# Patient Record
Sex: Female | Born: 1935 | Race: White | Hispanic: No | Smoking: Former smoker
Health system: Southern US, Community
[De-identification: ages and names within clinical notes are randomized; demographics above are authoritative.]

## PROBLEM LIST (undated history)

## (undated) DIAGNOSIS — I10 Essential (primary) hypertension: Secondary | ICD-10-CM

## (undated) DIAGNOSIS — M8580 Other specified disorders of bone density and structure, unspecified site: Secondary | ICD-10-CM

## (undated) DIAGNOSIS — I779 Disorder of arteries and arterioles, unspecified: Secondary | ICD-10-CM

## (undated) DIAGNOSIS — H409 Unspecified glaucoma: Secondary | ICD-10-CM

## (undated) DIAGNOSIS — I739 Peripheral vascular disease, unspecified: Secondary | ICD-10-CM

## (undated) DIAGNOSIS — R011 Cardiac murmur, unspecified: Secondary | ICD-10-CM

## (undated) DIAGNOSIS — F419 Anxiety disorder, unspecified: Secondary | ICD-10-CM

## (undated) DIAGNOSIS — H9319 Tinnitus, unspecified ear: Secondary | ICD-10-CM

## (undated) DIAGNOSIS — R7301 Impaired fasting glucose: Secondary | ICD-10-CM

## (undated) HISTORY — DX: Other specified disorders of bone density and structure, unspecified site: M85.80

## (undated) HISTORY — DX: Cardiac murmur, unspecified: R01.1

## (undated) HISTORY — DX: Tinnitus, unspecified ear: H93.19

## (undated) HISTORY — DX: Impaired fasting glucose: R73.01

## (undated) HISTORY — DX: Peripheral vascular disease, unspecified: I73.9

## (undated) HISTORY — DX: Essential (primary) hypertension: I10

## (undated) HISTORY — DX: Unspecified glaucoma: H40.9

## (undated) HISTORY — DX: Anxiety disorder, unspecified: F41.9

## (undated) HISTORY — DX: Disorder of arteries and arterioles, unspecified: I77.9

---
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
## 1998-07-02 ENCOUNTER — Other Ambulatory Visit: Admission: RE | Admit: 1998-07-02 | Discharge: 1998-07-02 | Payer: Self-pay | Admitting: Family Medicine

## 1999-08-30 ENCOUNTER — Other Ambulatory Visit: Admission: RE | Admit: 1999-08-30 | Discharge: 1999-08-30 | Payer: Self-pay | Admitting: Family Medicine

## 1999-10-01 ENCOUNTER — Emergency Department (HOSPITAL_COMMUNITY): Admission: EM | Admit: 1999-10-01 | Discharge: 1999-10-01 | Payer: Self-pay | Admitting: Emergency Medicine

## 1999-10-01 ENCOUNTER — Encounter: Payer: Self-pay | Admitting: Emergency Medicine

## 2000-05-29 HISTORY — PX: CHOLECYSTECTOMY: SHX55

## 2000-10-16 ENCOUNTER — Encounter: Admission: RE | Admit: 2000-10-16 | Discharge: 2001-01-14 | Payer: Self-pay | Admitting: Family Medicine

## 2000-11-19 ENCOUNTER — Encounter: Payer: Self-pay | Admitting: General Surgery

## 2000-11-21 ENCOUNTER — Ambulatory Visit (HOSPITAL_COMMUNITY): Admission: RE | Admit: 2000-11-21 | Discharge: 2000-11-21 | Payer: Self-pay | Admitting: *Deleted

## 2000-11-21 ENCOUNTER — Encounter: Payer: Self-pay | Admitting: *Deleted

## 2000-11-22 ENCOUNTER — Encounter (INDEPENDENT_AMBULATORY_CARE_PROVIDER_SITE_OTHER): Payer: Self-pay | Admitting: *Deleted

## 2000-11-22 ENCOUNTER — Inpatient Hospital Stay (HOSPITAL_COMMUNITY): Admission: RE | Admit: 2000-11-22 | Discharge: 2000-11-24 | Payer: Self-pay | Admitting: General Surgery

## 2005-01-28 ENCOUNTER — Ambulatory Visit (HOSPITAL_COMMUNITY): Admission: RE | Admit: 2005-01-28 | Discharge: 2005-01-28 | Payer: Self-pay | Admitting: Family Medicine

## 2005-02-02 ENCOUNTER — Emergency Department (HOSPITAL_COMMUNITY): Admission: EM | Admit: 2005-02-02 | Discharge: 2005-02-03 | Payer: Self-pay | Admitting: Emergency Medicine

## 2005-02-05 ENCOUNTER — Inpatient Hospital Stay (HOSPITAL_COMMUNITY): Admission: EM | Admit: 2005-02-05 | Discharge: 2005-02-07 | Payer: Self-pay | Admitting: Emergency Medicine

## 2008-01-29 ENCOUNTER — Emergency Department (HOSPITAL_COMMUNITY): Admission: EM | Admit: 2008-01-29 | Discharge: 2008-01-29 | Payer: Self-pay | Admitting: Emergency Medicine

## 2008-02-07 ENCOUNTER — Ambulatory Visit: Payer: Self-pay | Admitting: Vascular Surgery

## 2008-03-25 ENCOUNTER — Ambulatory Visit: Payer: Self-pay | Admitting: Vascular Surgery

## 2009-03-10 ENCOUNTER — Ambulatory Visit: Payer: Self-pay | Admitting: Vascular Surgery

## 2010-06-16 ENCOUNTER — Ambulatory Visit
Admission: RE | Admit: 2010-06-16 | Discharge: 2010-06-16 | Payer: Self-pay | Source: Home / Self Care | Attending: Vascular Surgery | Admitting: Vascular Surgery

## 2010-06-19 ENCOUNTER — Encounter: Payer: Self-pay | Admitting: Family Medicine

## 2010-06-24 NOTE — Procedures (Unsigned)
CAROTID DUPLEX EXAM  INDICATION:  Carotid stenosis.  HISTORY: Diabetes:  No. Cardiac:  No. Hypertension:  Yes. Smoking:  No. Previous Surgery:  No. CV History:  No. Amaurosis Fugax No, Paresthesias No, Hemiparesis No                                      RIGHT             LEFT Brachial systolic pressure:         178               180 Brachial Doppler waveforms:         Normal            Normal Vertebral direction of flow:        Antegrade         Antegrade DUPLEX VELOCITIES (cm/sec) CCA peak systolic                   77                81 ECA peak systolic                   127               123 ICA peak systolic                   162               118 ICA end diastolic                   38                26 PLAQUE MORPHOLOGY:                  Heterogenous      Mixed PLAQUE AMOUNT:                      Minimal/moderate  Minimal PLAQUE LOCATION:                    ECA, ICA          CCA, ICA, ECA  IMPRESSION: 1. Right ICA velocities suggest 40-59% stenosis. 2. Left ICA velocities suggest 1-39% stenosis. 3. Antegrade vertebral arteries bilaterally.  ___________________________________________ Janetta Hora Fields, MD  EM/MEDQ  D:  06/16/2010  T:  06/16/2010  Job:  [id number]

## 2010-10-11 NOTE — Procedures (Signed)
CAROTID DUPLEX EXAM   INDICATION:  Left arm and hand numbness.   HISTORY:  Diabetes:  No.  Cardiac:  No.  Hypertension:  Yes.  Smoking:  Previous.  Previous Surgery:  No.  CV History:  Left arm and hand numbness.  Amaurosis Fugax No, Paresthesias No, Hemiparesis No.                                       RIGHT             LEFT  Brachial systolic pressure:         153               153  Brachial Doppler waveforms:         Normal            Normal  Vertebral direction of flow:        Antegrade         Antegrade  DUPLEX VELOCITIES (cm/sec)  CCA peak systolic                   91                122  ECA peak systolic                   140               138  ICA peak systolic                   182               110  ICA end diastolic                   35                18  PLAQUE MORPHOLOGY:                  Heterogenous      Heterogenous  PLAQUE AMOUNT:                      Moderate          Mild  PLAQUE LOCATION:                    ICA/ECA           ICA/ECA   IMPRESSION:  1. 40-59% stenosis of the right internal carotid artery.  2. 1-39% stenosis of the left internal carotid artery.   Preliminary report was faxed to Dr. Jeannetta Nap office on 3:45 at 02/07/08.   ___________________________________________  Di Kindle. Edilia Bo, M.D.   CH/MEDQ  D:  02/07/2008  T:  02/07/2008  Job:  [id number]

## 2010-10-11 NOTE — Assessment & Plan Note (Signed)
OFFICE VISIT   CAELI, LINEHAN  DOB:  06/15/1920                                       03/25/2008  OZHYQ#:[id number]   The patient is an 77 year old female referred for evaluation of  asymptomatic carotid stenosis.  Her atherosclerotic risk factors include  age, hypertension, elevated cholesterol.  She denies history of prior  stroke or TIA.  She denies history of coronary artery disease.   PAST SURGICAL HISTORY:  Otherwise remarkable for a cholecystectomy.   PAST MEDICAL HISTORY:  As above and glaucoma.   MEDICATIONS:  Altace 10 mg once a day.  Atenolol 50 mg twice a day.  Hydrochlorothiazide 25 mg once a day.  Clonidine 0.2 mg 1-1/2 tablets in  the morning 1-1/2 tablets in the evening.  Lipitor 10 mg once a day.  Lorazepam 0.5 mg twice a day.  Aspirin 81 mg once a day.  Centrum 1 a  day.  Caltrate 600 D once a day.  Trazodone p.r.n. sleep.   She has no known drug allergies.   FAMILY HISTORY:  Unremarkable.   SOCIAL HISTORY:  She is widowed.  She is a former smoker, but quit 25  years ago.  She does not consume alcohol regularly.   REVIEW OF SYSTEMS:  She is 5 feet 4 inches, 145 pounds.  Cardiac,  pulmonary, GI, renal, vascular, neurologic, orthopedic, psychiatric,  ENT, and hematologic review of systems are all negative.   PHYSICAL EXAMINATION:  Blood pressure is 200/90 in the left arm, 207/90  in the right arm, pulse 61 and regular, temperature is 98.  HEENT is  unremarkable.  Neck has 2+ carotid pulses without bruits.  Chest is  clear to auscultation.  Cardiac exam is regular rate and rhythm without  murmur.  Abdomen is soft, nontender, nondistended with no masses.  Extremities, she has no edema.  She has 2+ carotid, brachial, radial,  femoral, dorsalis pedis pulses bilaterally.  She has 1+ popliteal pulses  bilaterally.   The patient had a carotid duplex exam on September 11, which showed a 40-  60% right internal carotid artery stenosis  and minimal stenosis on the  left side.  She had antegrade vertebral flow bilaterally.  This is  fairly similar to a previous carotid duplex exam performed in October  2007.   In summary, the patient is an 77 year old female who currently has no  neurologic symptoms, who had a recent duplex carotid exam, which showed  moderate stenosis.  I have reassured her that her risk of stroke from  this carotid stenosis would be very low at this level of stenosis.  She  should continue risk factor modification with treatment of her  hypertension, elevated cholesterol as Dr. Jeannetta Nap is doing.  She will  also continue her aspirin daily.  She needs a followup carotid duplex  scan in 1 year.  If, at that time, the symptoms have not changed  significantly, she may not need further exams after that, in light of  her advanced age.   Janetta Hora. Fields, MD  Electronically Signed   CEF/MEDQ  D:  04/01/2008  T:  04/02/2008  Job:  [id number]   cc:   Windle Guard, M.D.

## 2010-10-11 NOTE — Procedures (Signed)
CAROTID DUPLEX EXAM   INDICATION:  Follow up carotid artery disease.   HISTORY:  Diabetes:  No.  Cardiac:  No.  Hypertension:  Yes.  Smoking:  Previous.  Previous Surgery:  No.  CV History:  Asymptomatic.  Amaurosis Fugax No, Paresthesias No, Hemiparesis No.                                       RIGHT             LEFT  Brachial systolic pressure:         192               200  Brachial Doppler waveforms:         WNL               WNL  Vertebral direction of flow:        Antegrade         Antegrade  DUPLEX VELOCITIES (cm/sec)  CCA peak systolic                   94                129  ECA peak systolic                   147               172  ICA peak systolic                   178               108  ICA end diastolic                   40                18  PLAQUE MORPHOLOGY:                  Mixed             Mixed  PLAQUE AMOUNT:                      Moderate          Mild  PLAQUE LOCATION:                    ICA/ECA/CCA       ICA/ECA/CCA   IMPRESSION:  1. Right internal carotid artery shows evidence of 40-59% stenosis.  2. Left internal carotid artery shows evidence of 20-39% stenosis.  3. No significant changes from previous study.   ___________________________________________  Janetta Hora Fields, MD   AS/MEDQ  D:  03/10/2009  T:  03/10/2009  Job:  [id number]   cc:   Thora Lance, M.D.

## 2010-10-14 NOTE — Discharge Summary (Signed)
NAME:  [name], SCHNYDER NO.:  [id number]   MEDICAL RECORD NO.:  [id number]          PATIENT TYPE:  INP   LOCATION:  5511                         FACILITY:  [facility]   PHYSICIAN:  Theone Stanley, MD   DATE OF BIRTH:  1921/01/10   DATE OF ADMISSION:  02/05/2005  DATE OF DISCHARGE:                                 DISCHARGE SUMMARY   ADMITTING DIAGNOSES:  1.  Dizziness, confusion, vertigo.  2.  Dehydration.  3.  Mild renal insufficiency secondary to poor oral intake.  4.  Hypertension.  5.  Hyperlipidemia.  6.  Mild urinary tract infection.   DISCHARGE DIAGNOSES:  1.  Dizziness, congestion, vertigo.  2.  Dehydration.  3.  Mild renal insufficiency secondary to poor oral intake.  4.  Hypertension.  5.  Hyperlipidemia.  6.  Mild urinary tract infection.   CONSULTS:  Dr. Ezzard Standing and Dr. Lavonia Drafts of ENT was contacted in regard to  patient's tinnitus and they both expressed that this can be done and best be  done as an outpatient work-up.   PERTINENT LABORATORIES AND RADIOLOGY:  White count of 11, hemoglobin of 12,  hematocrit at 36, platelets at 270.  Sodium 134, potassium of 2.7, chloride  at 107, CO2 of 24, glucose at 135, BUN at 16, creatinine at 1.3.  An MRI was  ordered by Dr. Jeannetta Nap on September 2 which impression showed chronic  microvascular ischemic changes, no acute stroke, no abnormal intracranial  enhancement, no significant sinus disease.   HOSPITAL COURSE:  Mrs. [name] is a pleasant 77 year old female who is very  anxious and her main complaint is ringing in her ears.  Upon her  presentation, however, it was noted that she had some renal insufficiency  most likely secondary to poor oral intake.  After hydration her creatinine  came down to near normal.  In addition, because of her complaints of  dizziness orthostatics were performed which showed lying blood pressure  162/93, pulse of 109, sitting blood pressure of 142/83, pulse of 111,  standing  blood pressure 110/50, pulse of 111.  Because of this all her blood  pressure medications originally were stopped.  However, during her time here  in the hospital it was noted that she had some sinus tachycardia.  It was  felt that two reasons for this; number one, patient's high anxiety and  number two, her sudden stop in her beta blocker and also her Cardizem.  I  was able to contact Dr. Jeannetta Nap in regards to this patient and inform him of  her current state that she is mildly dehydrated, that she has been  rehydrated, and that I have stopped the majority of her blood pressure  medications secondary to her orthostatic BP.  In addition, per family  request I called Dr. Ezzard Standing to see if the appointment can be moved up;  however, it was not possible.  Upon her discharge patient was sitting up and  even at time joking indicating although she states she does not feel any  better in regard to her tinnitus, it appeared to be she was in better  spirits.   DISCHARGE MEDICATIONS:  1.  Valium 2.5 mg one p.o. t.i.d. p.r.n.  2.  Meclizine 25 mg one p.o. q.i.d.  3.  Atenolol 25 mg one p.o. daily.  4.  Aspirin is to be held until okay from Dr. Ezzard Standing.  He has stated that      this might make her tinnitus worse.  5.  Lipitor 10 mg one p.o. daily.  6.  She is to hold her Altace, trazodone if she is taking the Valium, and      hydrochlorothiazide.  7.  She is also to hold her verapamil unless her blood pressure is greater      or equal to 175.  She is to take her verapamil and then call Dr. Jeannetta Nap      with the situation.  8.  She is to continue her eye drops for her glaucoma.  9.  Vitamins.  10. Cipro 250 mg one p.o. b.i.d. for an additional five days.  11. Chlorpheniramine 4 mg one p.o. q.6 h. p.r.n.   DISCHARGE INSTRUCTIONS:  Patient was instructed to monitor her blood  pressure, record the results, and take that to Dr. Jeannetta Nap.  She was  encouraged to eat and drink to prevent any  dehydration.      Theone Stanley, MD  Electronically Signed     AEJ/MEDQ  D:  02/07/2005  T:  02/07/2005  Job:  [id number]   cc:   Windle Guard, M.D.  [address]  [place], [place] [address]  Fax: [phone]   Kristine Garbe. Ezzard Standing, M.D.  [address] [place]  [place] [address]  Fax: [phone]

## 2010-10-14 NOTE — H&P (Signed)
NAME:  [name], [name] NO.:  [id number]   MEDICAL RECORD NO.:  [id number]          PATIENT TYPE:  INP   LOCATION:  5511                         FACILITY:  [facility]   PHYSICIAN:  Hollice Espy, M.D.DATE OF BIRTH:  1921-05-22   DATE OF ADMISSION:  02/05/2005  DATE OF DISCHARGE:                                HISTORY & PHYSICAL   PRIMARY CARE PHYSICIAN:  Dr. Windle Guard   CHIEF COMPLAINT:  Dizziness, confusion, and vertigo.   Patient is an 77 year old white female with past medical history of  hypertension and hyperlipidemia who presents for several days of worsening  vertigo and lightheadedness.  Patient has been relatively well.  She is  followed by Dr. Jeannetta Nap as an outpatient and her only interhospital procedure  as of record was a cholecystectomy done in 2002 and says she has been fine  but for the past five or six days she has had problems with worsening  dizziness and lightheadedness on standing to the point where she can barely  stand up.  She has had no fevers, no nausea, vomiting, no ear pain, no  shortness of breath, no productive cough, and no dysuria, although this  history is obtained more from her husband as patient is currently confused  and not giving much of a history.  She came into the emergency room today  when she was so weak she could barely stand up and could not get out of bed.  She came into the emergency room for further evaluation.  In the emergency  room patient was noted to have an elevated blood pressure of 144/79.  Saturations were normal but her laboratories noted a white count of 14.7, a  neutrophil count of 77%.  H&H was unremarkable.  MCV of 91, platelet count  of 252.  The rest of her laboratories were unremarkable.  A BUN was noted to  be elevated at 37, a creatinine of 2, and a UA showed a moderate leukocyte  esterase with rare bacteria and 3-6 white cells.  Her CPK level was elevated  as well at 270 but her MB and troponin  levels were normal.  EKG was  unremarkable.  In addition, patient had just been to [facility] on  Saturday with having an MRI done as ordered by Dr. Jeannetta Nap which was  unremarkable for anything but some chronic atrophy and microvesicular  change.  Currently patient states she is doing okay, however, she goes in  and out of consciousness and occasionally reaches out her hands to grab  things that are not there.  I am unable to get a full review of systems  because of that.   PAST MEDICAL HISTORY:  Patient is status post a laparoscopic  cholecystectomy.  She has a history of hypertension, history of  hyperlipidemia.   MEDICATIONS:  Altace, verapamil, HCTZ, Lipitor, atenolol, aspirin,  Trazodone, Valium, and recently Phenergan and Valium.   She has no known drug allergies.   SOCIAL HISTORY:  No tobacco, alcohol, or drug use.  She lives with her  husband.   FAMILY HISTORY:  Noncontributory.   PHYSICAL  EXAMINATION:  VITAL SIGNS:  Temperature 97, heart rate 76, blood  pressure 144/79, O2 saturation 95% on room air.  GENERAL:  Patient appears to be sleeping, but easily awakened.  She is  alert.  She is oriented x2.  HEENT:  Normocephalic, atraumatic.  Cranial nerves appear to all be normal,  although a limited examination because of her confusion.  Mucous membranes  are dry.  She has no carotid bruits.  HEART:  Regular rate and rhythm, but very soft.  LUNGS:  Clear to auscultation bilaterally.  ABDOMEN:  Soft, nontender, nondistended.  Positive bowel sounds.  EXTREMITIES:  No clubbing, cyanosis, and trace edema.  Poor peripheral  pulses.   LABORATORIES:  White count 14.7, H&H 13 and 37.8, MCV 91, platelet count  252, 77% neutrophils which is on the high end of normal.  Sodium 132,  potassium 4, chloride 96, bicarbonate 24, BUN 37, creatinine 2, glucose 98.  LFTs are unremarkable.  UA is noted only for a moderate leukocyte esterase,  but only 3-6 white cells and rare bacteria.   CPK 270, MB 4.5, troponin I  less than 0.05.   ASSESSMENT/PLAN:  Acute renal failure.  The cause of this may be infection  from a urinary tract infection and/or patient's hypotensive agents,  especially her ACE and diuretic.  Would favor for now holding all of her  blood pressure medications, treating her with IV fluids.  She has no history  of congestive heart failure, so can treat her with IV running at 100  mL/hour.  Will also start her on IV Cipro.  1.  Report of history of dizziness.  This could be from orthostasis      secondary to dehydration but on the other hand it could be from an ear      infection, although she seems to have no ear pain.  When I tried to      examine her ears she moved around too much.  I was unable to get a good      visualization, however, intravenous Cipro should still cover this      regardless.  We will be able to follow her white count.  2.  Elevated CPK.  This is likely secondary to her being in bed for the last      few days with little ambulation.  3.  Leukocytosis, likely I feel from either a urinary tract infection or      another source.  Does not appear to be lung related.  4.  Hypertension.  Holding her antihypertensive agents.  Have p.r.n.      Lopressor ordered if her blood pressure should go higher.  5.  Hyperlipidemia.  Do not feel that her elevated CPK is secondary to      Lipitor.  It is too low for this, but will follow this.      Hollice Espy, M.D.  Electronically Signed     SKK/MEDQ  D:  02/05/2005  T:  02/06/2005  Job:  [id number]   cc:   Windle Guard, M.D.  [address]  [place], [place] [address]  Fax: [phone]

## 2010-10-14 NOTE — Op Note (Signed)
[facility]  Patient:    [name], [name]                     MRN: [id number] Proc. Date: 11/22/00 Adm. Date:  [id number] Attending:  Chevis Pretty S                           Operative Report  PREOPERATIVE DIAGNOSIS:  Cholelithiasis.  POSTOPERATIVE DIAGNOSIS:  Cholecystitis with cholelithiasis.  PROCEDURE:  Aborted laparoscopic and subsequent open cholecystectomy.  SURGEON:  Ollen Gross. Vernell Morgans, M.D.  ASSISTANT:  Milus Mallick, M.D.  ANESTHESIA:  General endotracheal.  DESCRIPTION OF PROCEDURE:  After informed consent was obtained, the patient was brought to the operating room and placed in the supine position on the operating room table.  After adequate induction of general anesthesia, the patients abdomen was prepped with Betadine and draped in the usual sterile manner.  The supraumbilical area was infiltrated with 0.25% Marcaine and a small transverse incision was made with the 15 blade knife.  This incision was carried down through the skin and subcutaneous tissues, and blunt dissection with the Kelly clamp and Army-Nav[employer] retractors until the linea alba was identified.  The linea alba was incised with the 15 blade knife and each side was grasped with Kocher clamps and elevated.  The preperitoneal space was probed bluntly with a hemostat until the peritoneum was opened and access was gained to the abdominal cavity.  A finger was inserted through this opening and to palpate the anterior abdominal wall, and there did not appear to be any adhesions.  A 0 Vicryl pursestring stitch was placed in the fascia surrounding this opening and a Hasson cannula was then placed through the opening into the abdominal cavity and anchored in place with a 0 Vicryl pursestring stitch. The abdomen was then insufflated with carbon dioxide without difficulty and a laparoscope was placed through the Hasson cannula.  The liver edge was readily identifiable and the  gallbladder appeared to be covered with omentum.  An upper midline small transverse incision was made after infiltrating this area with 0.25% Marcaine and the 10 mm port was placed bluntly through this incision into the abdominal cavity under direct vision.  There were several adhesions to the abdominal wall laterally on the right side of the abdomen, and these adhesions were taken down sharply with laparoscopic scissors.  This cleared the area in the right upper quadrant for placement of two 5 mm ports. The areas on the anterior abdominal wall were chosen and these areas were infiltrated with 0.25% Marcaine.  A stab incision was made with the 15 blade knife and two 5 mm ports were placed through these incisions bluntly into the abdominal cavity under direct vision.  It was readily apparent that the adhesions of the omentum to the gallbladder were fairly dense and difficult to take down laparoscopically, but this area was cleared.  The gallbladder was then elevated anteriorly and superiorly.  The area of the neck of the gallbladder, cystic duct junction was very inflamed, and difficult to dissect laparoscopically.  It appeared as though the area of the gallbladder neck cystic duct junction was identified and dissected in a blunt manner circumferentially, but the anatomy was not clear, and the cystic duct was dilated enough to make it difficult to put clips on.  At this point, the laparoscopic portion of the procedure was aborted and the abdomen was opened with a right subcostal  incision with the 10 blade knife. This incision was carried down through the skin and subcutaneous tissues using the Bovie electrocautery.  The rectus sheath and fascia of the anterior abdominal wall was opened also in a sharp manner with the Bovie electrocautery under direct vision.  Once this was complete, the liver edge was readily identifiable.  A sweetheart retractor was placed to elevate the edge of the right  lobe of the liver to allow visualization of the gallbladder.  The gallbladder was grasped with the Kelly clamp and elevated anteriorly.  Dissection at this point to remove the gallbladder from the liver bed was done with both sharply with the electrocautery and bluntly with the tonsil clamp until the gallbladder neck cystic duct junction was positively identified as the gallbladder narrowed down.  It appeared at the base of the wound that you could see where the cystic duct was connecting to the common duct and care was taken to clamp the cystic duct very high near the gallbladder neck away from where the common duct appeared to be.  Another right angle clamp was then placed above this on the gallbladder to keep the stones from spilling, and the gallbladder neck area was then divided between the two clamps with the Metzenbaum scissors. The gallbladder specimen was sent to pathology.  The cystic duct was then ligated with a 2-0 silk suture ligature.  The wound was then examined and found to be hemostatic.  A drain was brought through the anterior abdominal wall through the lateral most 5 mm port and placed within the liver bed where the gallbladder had sat.  This drain was anchored to the skin with a 2-0 silk suture tie.  The fascia of the anterior abdominal wall was then closed in two layers with running #1 PDS sutures.  The skin incisions were then closed with staples.  The fascia at the supraumbilical port was closed with the previously placed Vicryl pursestring stitch.  Sterile dressings were applied.  The patient tolerated the procedure well.  At the end of the case, all needle, sponge, and instrument counts were correct.  The patient was then awakened and taken to the recovery room in stable condition. DD:  11/22/00 TD:  11/23/00 Job: [id number]

## 2011-03-01 LAB — DIFFERENTIAL
Basophils Relative: 1
Eosinophils Absolute: 0.1
Eosinophils Relative: 1
Monocytes Relative: 9
Neutrophils Relative %: 64

## 2011-03-01 LAB — POCT I-STAT, CHEM 8
Calcium, Ion: 1.15
Glucose, Bld: 120 — ABNORMAL HIGH
HCT: 35 — ABNORMAL LOW
Hemoglobin: 11.9 — ABNORMAL LOW
Potassium: 4
TCO2: 26

## 2011-03-01 LAB — CBC
HCT: 32.7 — ABNORMAL LOW
Hemoglobin: 11.1 — ABNORMAL LOW
MCHC: 33.8
MCV: 94
Platelets: 228
RBC: 3.48 — ABNORMAL LOW
RDW: 13.7
WBC: 7.3

## 2012-02-27 HISTORY — PX: GLAUCOMA SURGERY: SHX656

## 2012-04-02 ENCOUNTER — Encounter: Payer: Self-pay | Admitting: Vascular Surgery

## 2013-12-10 ENCOUNTER — Other Ambulatory Visit: Payer: Self-pay | Admitting: Internal Medicine

## 2013-12-10 DIAGNOSIS — I6529 Occlusion and stenosis of unspecified carotid artery: Secondary | ICD-10-CM

## 2013-12-17 ENCOUNTER — Ambulatory Visit
Admission: RE | Admit: 2013-12-17 | Discharge: 2013-12-17 | Disposition: A | Discharge status: Home / Self Care | Payer: Medicare Other | Source: Ambulatory Visit | Attending: Internal Medicine | Admitting: Internal Medicine

## 2013-12-17 DIAGNOSIS — I6529 Occlusion and stenosis of unspecified carotid artery: Secondary | ICD-10-CM

## 2014-03-11 ENCOUNTER — Other Ambulatory Visit: Payer: Self-pay

## 2014-03-21 ENCOUNTER — Encounter: Payer: Self-pay | Admitting: *Deleted

## 2015-02-12 IMAGING — US US CAROTID DUPLEX BILAT
1 series · 13 of 24 positions shown · non-contrast
Comparison: Report only dated 03/22/2006

CLINICAL DATA: Carotid artery stenosis

EXAM:
BILATERAL CAROTID DUPLEX ULTRASOUND
TECHNIQUE: Gray scale imaging, color Doppler and duplex ultrasound were
performed of bilateral carotid and vertebral arteries in the neck.

[Series 1: us carotid duplex bilat · 0.08mm/px · 13 of 71 slices shown]
[im 1/71]
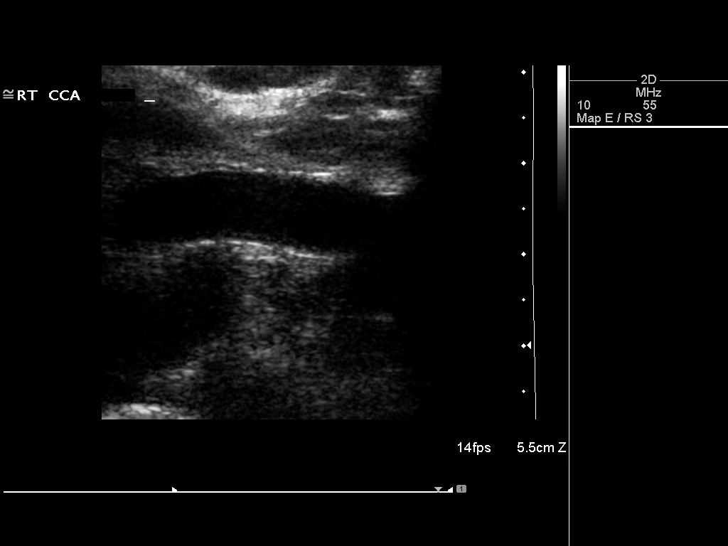
[im 7/71]
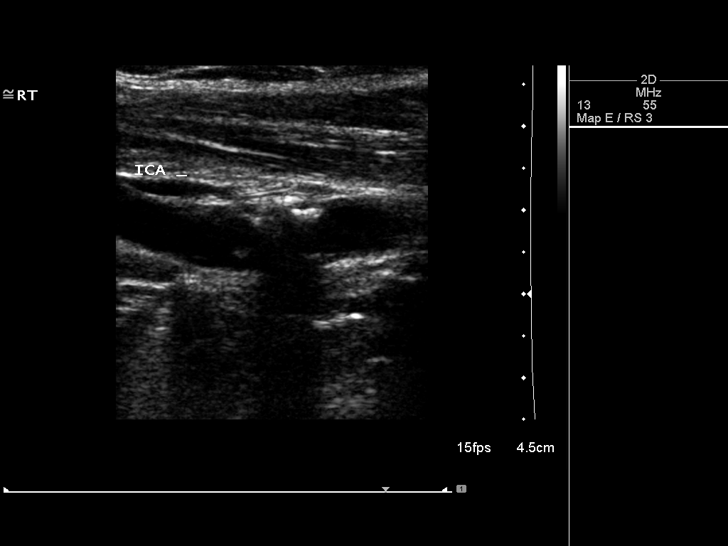
[im 13/71]
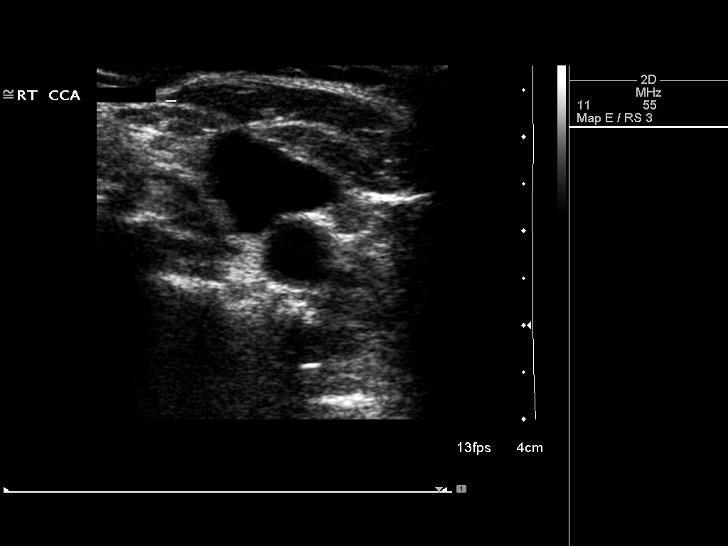
[im 19/71]
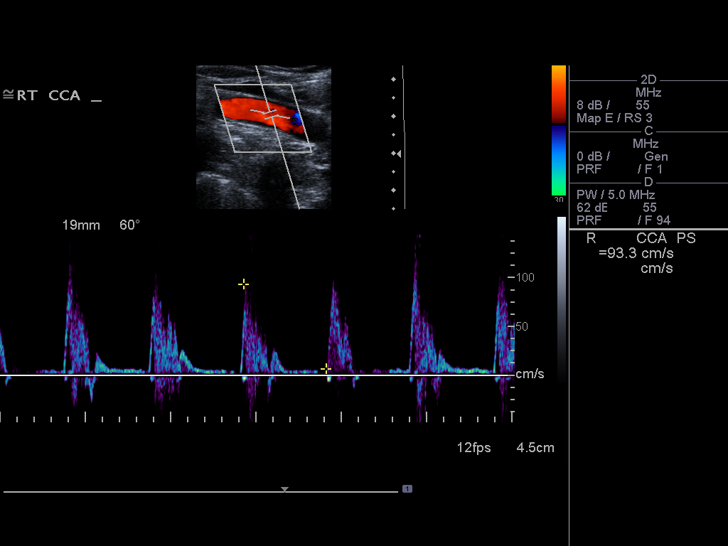
[im 25/71]
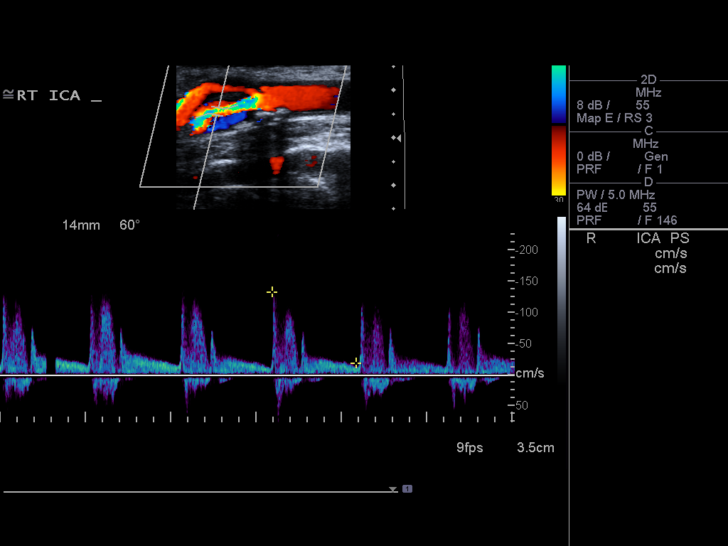
[im 31/71]
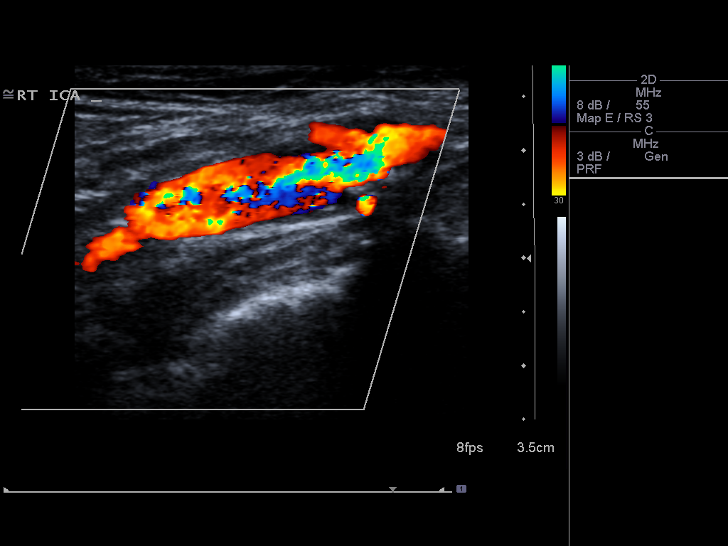
[im 37/71]
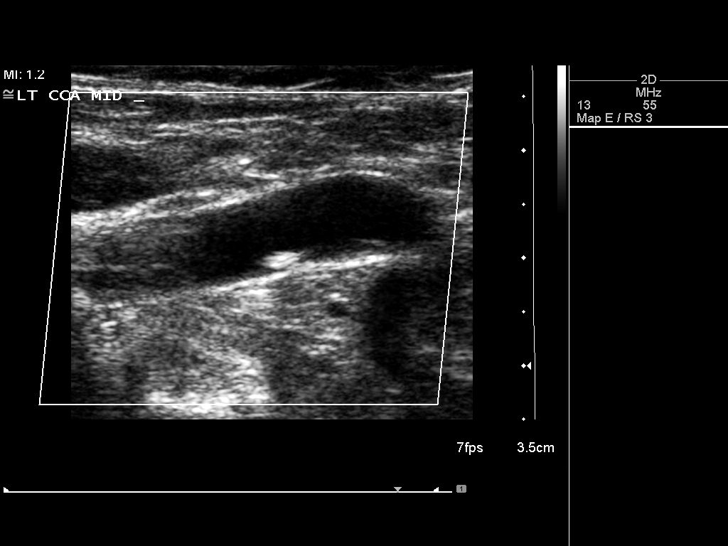
[im 40/71]
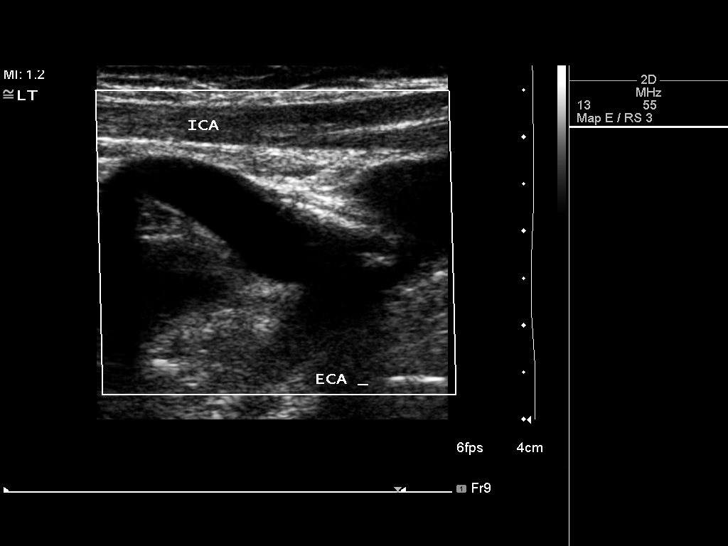
[im 46/71]
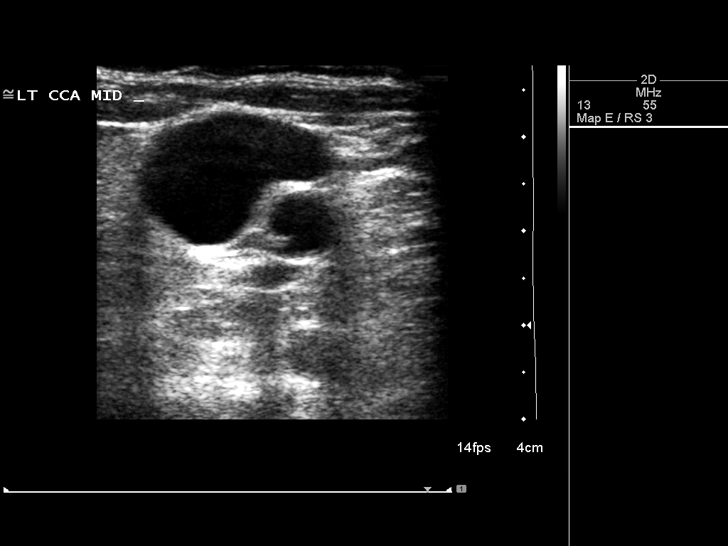
[im 52/71]
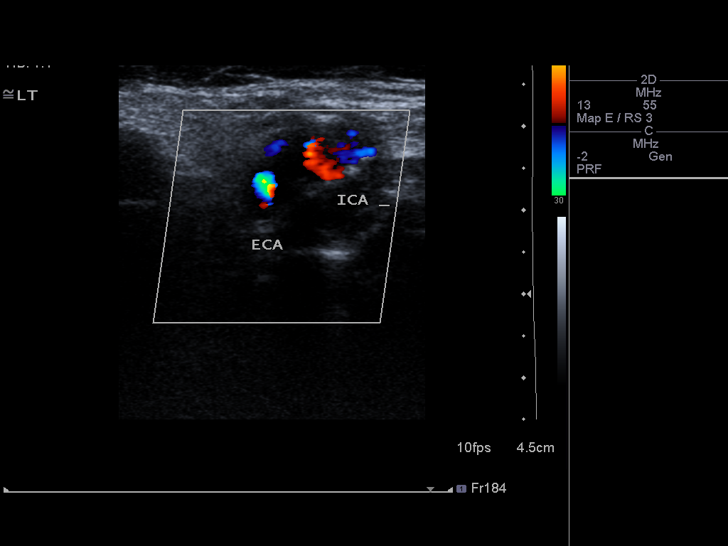
[im 58/71]
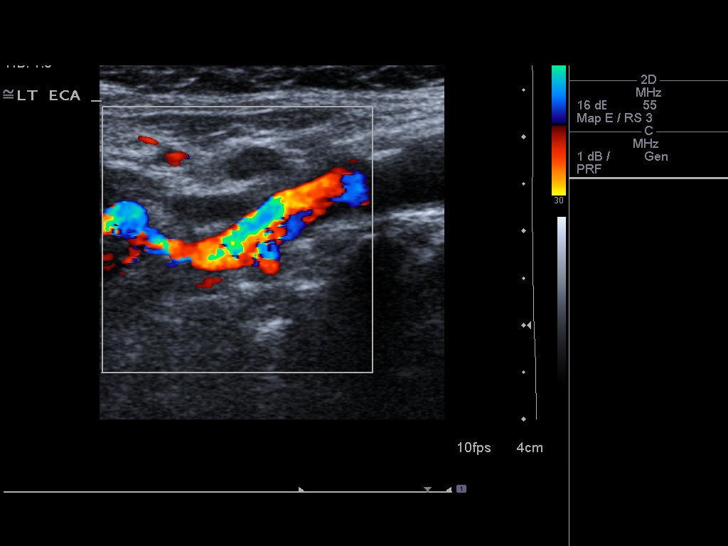
[im 64/71]
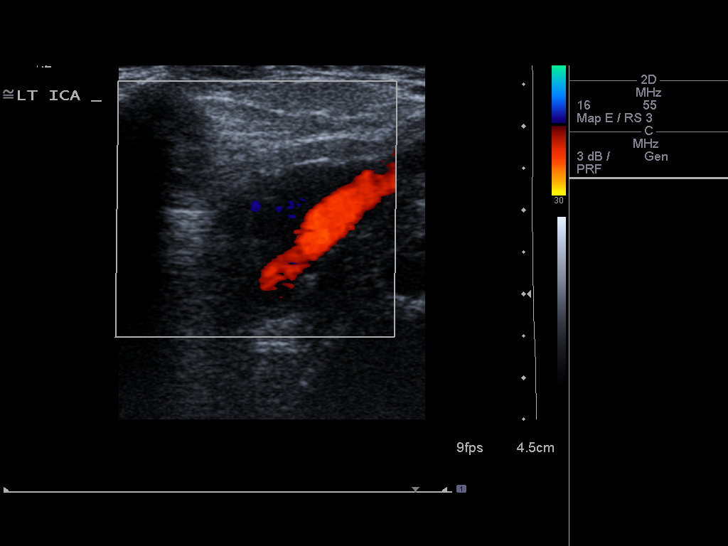
[im 71/71]
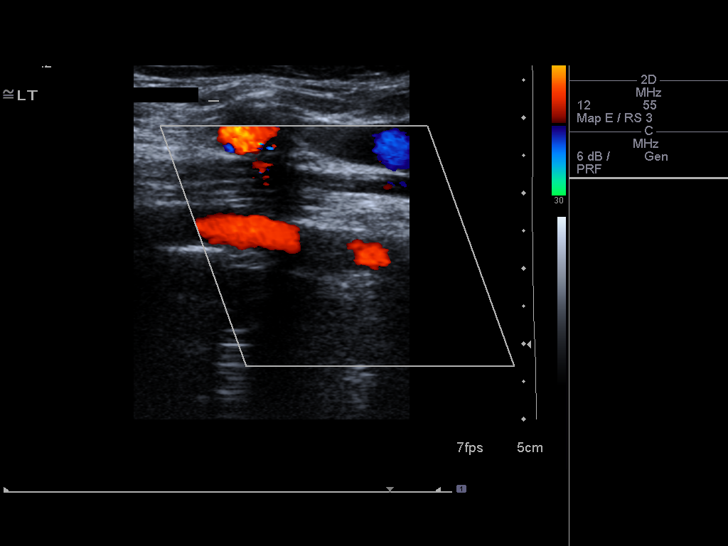

[13 of 24 positions shown; findings below may reference images not displayed]

FINDINGS: Criteria: Quantification of carotid stenosis is based on velocity
parameters that correlate the residual internal carotid diameter
with NASCET-based stenosis levels, using the diameter of the distal
internal carotid lumen as the denominator for stenosis measurement.

The following velocity measurements were obtained:

RIGHT

ICA:  197 cm/sec

CCA:  93 cm/sec

SYSTOLIC ICA/CCA RATIO:

DIASTOLIC ICA/CCA RATIO:

ECA:  104 cm/sec

LEFT

ICA:  153 cm/sec

CCA:  108 cm/sec

SYSTOLIC ICA/CCA RATIO:

DIASTOLIC ICA/CCA RATIO:

ECA:  166 cm/sec

RIGHT CAROTID ARTERY: Moderate calcified plaque in the bulb and
lower internal carotid. Low resistance internal carotid Doppler
pattern is preserved.

RIGHT VERTEBRAL ARTERY:  Antegrade.  Normal Doppler pattern.

LEFT CAROTID ARTERY: Moderate calcified plaque in the left bulb. Low
resistance internal carotid Doppler pattern.

LEFT VERTEBRAL ARTERY:  Antegrade.  Normal Doppler pattern.
IMPRESSION: 50-69% stenosis in the right and left internal carotid arteries.

## 2015-07-14 ENCOUNTER — Observation Stay (HOSPITAL_COMMUNITY)
Admission: EM | Admit: 2015-07-14 | Discharge: 2015-07-14 | Disposition: A | Discharge status: Home / Self Care | Payer: Medicare Other | Attending: Family Medicine | Admitting: Family Medicine

## 2015-07-14 ENCOUNTER — Other Ambulatory Visit: Payer: Self-pay | Admitting: Physician Assistant

## 2015-07-14 ENCOUNTER — Emergency Department (HOSPITAL_COMMUNITY): Payer: Medicare Other

## 2015-07-14 ENCOUNTER — Encounter (HOSPITAL_COMMUNITY): Payer: Self-pay | Admitting: *Deleted

## 2015-07-14 DIAGNOSIS — H409 Unspecified glaucoma: Secondary | ICD-10-CM | POA: Insufficient documentation

## 2015-07-14 DIAGNOSIS — Z7982 Long term (current) use of aspirin: Secondary | ICD-10-CM | POA: Insufficient documentation

## 2015-07-14 DIAGNOSIS — R079 Chest pain, unspecified: Principal | ICD-10-CM | POA: Diagnosis present

## 2015-07-14 DIAGNOSIS — R05 Cough: Secondary | ICD-10-CM | POA: Insufficient documentation

## 2015-07-14 DIAGNOSIS — Z87891 Personal history of nicotine dependence: Secondary | ICD-10-CM | POA: Insufficient documentation

## 2015-07-14 DIAGNOSIS — I447 Left bundle-branch block, unspecified: Secondary | ICD-10-CM | POA: Diagnosis not present

## 2015-07-14 DIAGNOSIS — R011 Cardiac murmur, unspecified: Secondary | ICD-10-CM

## 2015-07-14 DIAGNOSIS — R9431 Abnormal electrocardiogram [ECG] [EKG]: Secondary | ICD-10-CM | POA: Diagnosis not present

## 2015-07-14 DIAGNOSIS — I358 Other nonrheumatic aortic valve disorders: Secondary | ICD-10-CM

## 2015-07-14 DIAGNOSIS — R000 Tachycardia, unspecified: Secondary | ICD-10-CM

## 2015-07-14 DIAGNOSIS — I10 Essential (primary) hypertension: Secondary | ICD-10-CM | POA: Insufficient documentation

## 2015-07-14 DIAGNOSIS — I119 Hypertensive heart disease without heart failure: Secondary | ICD-10-CM

## 2015-07-14 DIAGNOSIS — R1310 Dysphagia, unspecified: Secondary | ICD-10-CM | POA: Insufficient documentation

## 2015-07-14 LAB — COMPREHENSIVE METABOLIC PANEL
ALBUMIN: 4 g/dL (ref 3.5–5.0)
ALT: 21 U/L (ref 14–54)
AST: 32 U/L (ref 15–41)
Alkaline Phosphatase: 54 U/L (ref 38–126)
Anion gap: 18 — ABNORMAL HIGH (ref 5–15)
BILIRUBIN TOTAL: 0.6 mg/dL (ref 0.3–1.2)
BUN: 21 mg/dL — AB (ref 6–20)
CHLORIDE: 105 mmol/L (ref 101–111)
CO2: 19 mmol/L — ABNORMAL LOW (ref 22–32)
CREATININE: 1.17 mg/dL — AB (ref 0.44–1.00)
Calcium: 9.4 mg/dL (ref 8.9–10.3)
GFR calc Af Amer: 45 mL/min — ABNORMAL LOW (ref >60)
GFR, EST NON AFRICAN AMERICAN: 39 mL/min — AB (ref >60)
GLUCOSE: 135 mg/dL — AB (ref 65–99)
POTASSIUM: 3.7 mmol/L (ref 3.5–5.1)
Sodium: 142 mmol/L (ref 135–145)
Total Protein: 7.1 g/dL (ref 6.5–8.1)

## 2015-07-14 LAB — CBC WITH DIFFERENTIAL/PLATELET
BASOS ABS: 0 10*3/uL (ref 0.0–0.1)
BASOS PCT: 0 %
Eosinophils Absolute: 0 10*3/uL (ref 0.0–0.7)
Eosinophils Relative: 0 %
HEMATOCRIT: 36.2 % (ref 36.0–46.0)
Hemoglobin: 12 g/dL (ref 12.0–15.0)
LYMPHS PCT: 17 %
Lymphs Abs: 1.7 10*3/uL (ref 0.7–4.0)
MCH: 31.4 pg (ref 26.0–34.0)
MCHC: 33.1 g/dL (ref 30.0–36.0)
MCV: 94.8 fL (ref 78.0–100.0)
MONO ABS: 0.8 10*3/uL (ref 0.1–1.0)
Monocytes Relative: 8 %
NEUTROS ABS: 7.2 10*3/uL (ref 1.7–7.7)
NEUTROS PCT: 75 %
PLATELETS: 282 10*3/uL (ref 150–400)
RBC: 3.82 MIL/uL — AB (ref 3.87–5.11)
RDW: 13.4 % (ref 11.5–15.5)
WBC: 9.7 10*3/uL (ref 4.0–10.5)

## 2015-07-14 LAB — LIPASE, BLOOD: Lipase: 34 U/L (ref 11–51)

## 2015-07-14 LAB — I-STAT TROPONIN, ED: TROPONIN I, POC: 0.02 ng/mL (ref 0.00–0.08)

## 2015-07-14 LAB — BRAIN NATRIURETIC PEPTIDE: B Natriuretic Peptide: 141.8 pg/mL — ABNORMAL HIGH (ref 0.0–100.0)

## 2015-07-14 MED ORDER — ATENOLOL 25 MG PO TABS: 25.0000 mg | ORAL_TABLET | Freq: Every day | ORAL | Status: DC

## 2015-07-14 MED ORDER — NITROGLYCERIN 2 % TD OINT
1.0000 [in_us] | TOPICAL_OINTMENT | Freq: Once | TRANSDERMAL | Status: AC
  Administered 2015-07-14: 1 [in_us] via TOPICAL
  Filled 2015-07-14: qty 1

## 2015-07-14 MED ORDER — METOPROLOL TARTRATE 1 MG/ML IV SOLN
5.0000 mg | Freq: Once | INTRAVENOUS | Status: AC
  Administered 2015-07-14: 5 mg via INTRAVENOUS
  Filled 2015-07-14: qty 5

## 2015-07-14 MED ORDER — GI COCKTAIL ~~LOC~~
30.0000 mL | Freq: Once | ORAL | Status: AC
  Administered 2015-07-14: 30 mL via ORAL
  Filled 2015-07-14: qty 30

## 2015-07-14 MED ORDER — METOPROLOL TARTRATE 25 MG PO TABS: 12.5000 mg | ORAL_TABLET | Freq: Two times a day (BID) | ORAL | Status: DC

## 2015-07-14 MED ORDER — PANTOPRAZOLE SODIUM 40 MG IV SOLR
40.0000 mg | Freq: Once | INTRAVENOUS | Status: AC
  Administered 2015-07-14: 40 mg via INTRAVENOUS
  Filled 2015-07-14: qty 40

## 2015-07-14 MED ORDER — ASPIRIN 81 MG PO CHEW
324.0000 mg | CHEWABLE_TABLET | Freq: Once | ORAL | Status: AC
  Administered 2015-07-14: 243 mg via ORAL
  Filled 2015-07-14: qty 4

## 2015-07-14 NOTE — ED Provider Notes (Addendum)
CSN: [id number]     Arrival date & time 07/14/15  1331 History   First MD Initiated Contact with Patient 07/14/15 1358     Chief Complaint  Patient presents with  . Nausea  . Tachycardia     (Consider location/radiation/quality/duration/timing/severity/associated sxs/prior Treatment) HPI Patient is alert and appropriate but slightly difficult historian with details. She describes not feeling well today. She wasn't sure if was anything particularly serious but decided not to play her usual bridge. She endorses some degree of chest tightness but suspects that is due to reflux. Last week apparently she has had some similar symptoms and tried to induce vomiting to relieve a sense of nausea. The symptoms then spontaneously resolved. Now yesterday and today there is been some degree of nausea. Patient notes that she usually exercises without shortness of breath. She has not noted herself to be significantly short of breath but does note now that she is coughed up some white bubbly phlegm today. No fever. She denies calf pain or swelling. She does report she has "whitecoat syndrome" he puts her blood pressure is often up when she gets an examination. He states he took prior to coming to the hospital and her systolic pressure was at 160. Past Medical History  Diagnosis Date  . Hypertension   . Anxiety   . Glaucoma   . Tinnitus   . Carotid artery disease (HCC)   . Osteopenia   . Systolic murmur   . Impaired fasting glucose    Past Surgical History  Procedure Laterality Date  . Cholecystectomy  2002  . Tonsillectomy  1948  . Glaucoma surgery  02/2012   History reviewed. No pertinent family history. Social History  Substance Use Topics  . Smoking status: Former Smoker    Quit date: 03/22/1979  . Smokeless tobacco: None  . Alcohol Use: No   OB History    No data available     Review of Systems 10 Systems reviewed and are negative for acute change except as noted in the  HPI.    Allergies  Review of patient's allergies indicates no known allergies.  Home Medications   Prior to Admission medications   Medication Sig Start Date End Date Taking? Authorizing Provider  aspirin 81 MG chewable tablet Chew 81 mg by mouth daily.    Yes Historical Provider, MD  bimatoprost (LUMIGAN) 0.03 % ophthalmic solution Place 1 drop into both eyes at bedtime.   Yes Historical Provider, MD  brinzolamide (AZOPT) 1 % ophthalmic suspension Place 1 drop into both eyes 3 (three) times daily.    Yes Historical Provider, MD  Calcium Carb-Cholecalciferol (CALCIUM 600 + D) 600-200 MG-UNIT TABS Take 1 tablet by mouth 2 (two) times daily.    Yes Historical Provider, MD  glucose blood test strip 1 each by Other route as needed for other. Use as instructed   Yes Historical Provider, MD  hydrochlorothiazide (HYDRODIURIL) 25 MG tablet Take 12.5 mg by mouth daily.    Yes Historical Provider, MD  LORazepam (ATIVAN) 0.5 MG tablet Take 0.25 mg by mouth 2 (two) times daily as needed for anxiety.    Yes Historical Provider, MD  Multiple Vitamin (MULTIVITAMIN) capsule Take 1 capsule by mouth daily.   Yes Historical Provider, MD  pilocarpine (PILOCAR) 4 % ophthalmic solution Place 1 drop into both eyes 4 (four) times daily.    Yes Historical Provider, MD  ramipril (ALTACE) 10 MG capsule Take 10 mg by mouth daily.   Yes Historical Provider, MD  sertraline (ZOLOFT) 50 MG tablet Take 25 mg by mouth daily.    Yes Historical Provider, MD  timolol (BETIMOL) 0.5 % ophthalmic solution Place 1 drop into both eyes 2 (two) times daily.   Yes Historical Provider, MD   BP 181/94 mmHg  Pulse 117  Temp(Src) 98.3 F (36.8 C) (Oral)  Resp 28  Ht  (1.575 m)  Wt 150 lb (68.04 kg)  BMI 27.43 kg/m2  SpO2 96% Physical Exam  Constitutional: She is oriented to person, place, and time. She appears well-developed and well-nourished.  Patient  is well appearing for age. No respiratory distress at rest. Clear  mental status with normal speech.  HENT:  Head: Normocephalic and atraumatic.  Mouth/Throat: Oropharynx is clear and moist.  Eyes: EOM are normal. Pupils are equal, round, and reactive to light.  Neck: Neck supple.  Cardiovascular: Regular rhythm and intact distal pulses.   Tachycardia. 2/6 systolic ejection murmur.  Pulmonary/Chest: Effort normal.  Bilateral fine Rales at lung bases.  Abdominal: Soft. Bowel sounds are normal. She exhibits no distension. There is no tenderness.  Musculoskeletal: Normal range of motion. She exhibits no edema.  Trace pretibial edema. No calf tenderness.  Neurological: She is alert and oriented to person, place, and time. She has normal strength. No cranial nerve deficit. She exhibits normal muscle tone. Coordination normal. GCS eye subscore is 4. GCS verbal subscore is 5. GCS motor subscore is 6.  Skin: Skin is warm, dry and intact.  Psychiatric: She has a normal mood and affect.    ED Course  Procedures (including critical care time) Labs Review Labs Reviewed  CBC WITH DIFFERENTIAL/PLATELET - Abnormal; Notable for the following:    RBC 3.82 (*)    All other components within normal limits  COMPREHENSIVE METABOLIC PANEL - Abnormal; Notable for the following:    CO2 19 (*)    Glucose, Bld 135 (*)    BUN 21 (*)    Creatinine, Ser 1.17 (*)    GFR calc non Af Amer 39 (*)    GFR calc Af Amer 45 (*)    Anion gap 18 (*)    All other components within normal limits  LIPASE, BLOOD  BRAIN NATRIURETIC PEPTIDE  I-STAT TROPOININ, ED    Imaging Review Dg Chest 2 View  07/14/2015  CLINICAL DATA:  Chest pain EXAM: CHEST  2 VIEW COMPARISON:  01/29/2008 FINDINGS: Cardiac shadow is within normal limits and stable. Aortic calcifications are again seen with tortuosity. Hiatal hernia is seen as well. Lungs are clear bilaterally. No sizable effusion is noted. No bony abnormality is seen. IMPRESSION: Chronic changes without acute abnormality. Electronically Signed    By: Alcide Clever M.D.   On: 07/14/2015 14:22   I have personally reviewed and evaluated these images and lab results as part of my medical decision-making.   EKG Interpretation   Date/Time:  Wednesday July 14 2015 13:37:37 EST Ventricular Rate:  135 PR Interval:    QRS Duration: 130 QT Interval:  370 QTC Calculation: 555 R Axis:   -45 Text Interpretation:  Atrial flutter with predominant 2:1 AV block Left  bundle branch block Baseline wander in lead(s) V6 agree. lateral T wave  inversion Confirmed by Donnald Garre, MD, Lebron Conners [phone]) on 07/14/2015 3:02:29 PM      Consult: Hospitalist consulted for admission. Request cardiology consultation. And re-page with BNP result. MDM   Final diagnoses:  Tachycardia  Abnormal EKG  Essential hypertension   Patient has very vague description of symptoms.  She is alert and cognitively sharp. Concern is for anginal and/or hypertensive urgency. Patient has left bundle branch block without old comparison available. She thinks the last time she had any EKG would've been in 2002 when she had a gallbladder surgery. She denies ever having had a cardiac catheterization or stress test that she can recall. First troponin is negative. I will be for admission for rule out MI/CHF and monitoring. Lopressor and aspirin given.    Arby Barrette, MD 07/14/15 1548  Arby Barrette, MD 07/14/15 [phone]

## 2015-07-14 NOTE — ED Notes (Signed)
Pt arrives from [place] via [facility]. Pt states she has been having nausea x2 days and has been inducing vomiting to relieve nausea. Pt is in a new LBBB and is tachycardic around 132 upon arrival. Pt denies cp, weakness or dizziness.

## 2015-07-14 NOTE — Consult Note (Addendum)
[facility] Medical Consultation  [name] ZOX:[id number] DOB: 1921/01/28 DOA: 07/14/2015 PCP: Lillia Mountain, MD   Requesting physician: Dr Donnald Garre Date of consultation: 07/14/15  Reason for consultation: Cough  Impression/Recommendations Active Problems:   Chest pain  Hypertension and tachycardia: Symptoms likely secondary to underlying condition of hypertension, significant anxiety and white coat syndrome. Patient with significant improvement in condition with Lopressor 5 mg given in ED. EKG performed on patient shows left bundle branch block. No previous EKG to compare. No evidence of acute coronary syndrome. Consult to cardiology who agrees that patient needs an addition of a beta blocker to help improve her symptoms. Would favor patient run a little hypertensive as opposed hypotensive given she is high risk for fall and hip fracture. Recommend patient restart Atenolol daily, and follow-up with cardiology for Echo  Frothy cough: No evidence of heart failure given BNP of 141 and CXR without evidence of pulmonary congestion. Patient's ankle edema at baseline. Patient with significant history of gastric discomfort and occasional reflux, and occasional dysphagia for solids. Suspect patient may have underlying GERD with possible silent reflux causing esophageal injury and possible stenosis. Patient does not have any history of significant dysphagia or aspiration. Recommend patient start daily PPI and follow-up with GI  I will followup again tomorrow. Please contact me if I can be of assistance in the meanwhile. Thank you for this consultation.  Chief Complaint: Frothy cough and hypotension and tachycardia after ambulating at [employer].  HPI:  She reports being sent here from [place] after she was noted to have a systolic blood pressure 190 range and a heart rate in the 120 to 1:30 range. Patient asked for medical staff there to check her blood pressure heart rate  after she thought that it might be elevated while walking on their track. Patient had no symptoms at the time which to spur in the investigation. Patient actively denying chest pain, palpitations, shortness of breath, nausea, vomiting, diaphoresis, fever, dysuria, back pain. Patient states that she chronically has an elevated blood pressure and a fast heart rate which she is very anxious about. Patient has never had a cardiac catheterization or stress test that she can remember. Patient has ankle edema which is at her baseline. She denies any orthopnea or unintentional weight gain. Patient has had a slight frothy cough over the last several weeks. This sometimes causes some sensation of chest tightness which is immediately relieved after she coughs up the phlegm. Patient also endorses occasional dysphagia for some solids which is easily cleared by coughing or swallowing of liquids. Patient has not seen a GI doctor for this.   Review of Systems:  Per HPI w/ all other systems negative    Past Medical History  Diagnosis Date  . Hypertension   . Anxiety   . Glaucoma   . Tinnitus   . Carotid artery disease (HCC)   . Osteopenia   . Systolic murmur   . Impaired fasting glucose    Past Surgical History  Procedure Laterality Date  . Cholecystectomy  2002  . Tonsillectomy  1948  . Glaucoma surgery  02/2012   Social History:  reports that she quit smoking about 36 years ago. She does not have any smokeless tobacco history on file. She reports that she does not drink alcohol. Her drug history is not on file.  No Known Allergies Family History  Problem Relation Age of Onset  . Heart disease Father   . Brain cancer Mother  Prior to Admission medications   Medication Sig Start Date End Date Taking? Authorizing Provider  aspirin 81 MG chewable tablet Chew 81 mg by mouth daily.    Yes Historical Provider, MD  bimatoprost (LUMIGAN) 0.03 % ophthalmic solution Place 1 drop into both eyes at  bedtime.   Yes Historical Provider, MD  brinzolamide (AZOPT) 1 % ophthalmic suspension Place 1 drop into both eyes 3 (three) times daily.    Yes Historical Provider, MD  Calcium Carb-Cholecalciferol (CALCIUM 600 + D) 600-200 MG-UNIT TABS Take 1 tablet by mouth 2 (two) times daily.    Yes Historical Provider, MD  glucose blood test strip 1 each by Other route as needed for other. Use as instructed   Yes Historical Provider, MD  hydrochlorothiazide (HYDRODIURIL) 25 MG tablet Take 12.5 mg by mouth daily.    Yes Historical Provider, MD  LORazepam (ATIVAN) 0.5 MG tablet Take 0.25 mg by mouth 2 (two) times daily as needed for anxiety.    Yes Historical Provider, MD  metoprolol (LOPRESSOR) 25 MG tablet Take 0.5 tablets (12.5 mg total) by mouth 2 (two) times daily. 07/14/15   Gwyneth Sprout, MD  Multiple Vitamin (MULTIVITAMIN) capsule Take 1 capsule by mouth daily.   Yes Historical Provider, MD  pilocarpine (PILOCAR) 4 % ophthalmic solution Place 1 drop into both eyes 4 (four) times daily.    Yes Historical Provider, MD  ramipril (ALTACE) 10 MG capsule Take 10 mg by mouth daily.   Yes Historical Provider, MD  sertraline (ZOLOFT) 50 MG tablet Take 25 mg by mouth daily.    Yes Historical Provider, MD  timolol (BETIMOL) 0.5 % ophthalmic solution Place 1 drop into both eyes 2 (two) times daily.   Yes Historical Provider, MD   Physical Exam: Blood pressure 178/77, pulse 93, temperature 98.3 F (36.8 C), temperature source Oral, resp. rate 19, height  (1.575 m), weight 68.04 kg (150 lb), SpO2 97 %. Filed Vitals:   07/14/15 1630 07/14/15 1645  BP: 178/98 178/77  Pulse: 94 93  Temp:    Resp: 24 19   Physical Exam  Constitutional: Elderly but oriented to person, place, and time. appears well-developed and well-nourished. No distress.  HENT:  Head: Normocephalic and atraumatic.  Eyes: EOMI. PERRL.  Neck: Normal range of motion.  Cardiovascular: RRR, III/VI systolic murmur, 2+ distal pulses,   Pulmonary/Chest: Effort normal and breath sounds normal. No respiratory distress.  Abdominal: Soft. Bowel sounds are normal. NonTTP, no distension.  Musculoskeletal: Normal range of motion. Non ttp, no effusion.  Neurological: alert and oriented to person, place, and time.  Skin: Skin is warm. No rash noted. non diaphoretic.  Psychiatric: normal mood and affect. behavior is normal. Judgment and thought content normal.     Labs on Admission:  Basic Metabolic Panel:  Recent Labs Lab 07/14/15 1345  NA 142  K 3.7  CL 105  CO2 19*  GLUCOSE 135*  BUN 21*  CREATININE 1.17*  CALCIUM 9.4   Liver Function Tests:  Recent Labs Lab 07/14/15 1345  AST 32  ALT 21  ALKPHOS 54  BILITOT 0.6  PROT 7.1  ALBUMIN 4.0    Recent Labs Lab 07/14/15 1345  LIPASE 34   No results for input(s): AMMONIA in the last 168 hours. CBC:  Recent Labs Lab 07/14/15 1345  WBC 9.7  NEUTROABS 7.2  HGB 12.0  HCT 36.2  MCV 94.8  PLT 282   Cardiac Enzymes: No results for input(s): CKTOTAL, CKMB, CKMBINDEX, TROPONINI in the  last 168 hours. BNP: Invalid input(s): POCBNP CBG: No results for input(s): GLUCAP in the last 168 hours.  Radiological Exams on Admission: Dg Chest 2 View  07/14/2015  CLINICAL DATA:  Chest pain EXAM: CHEST  2 VIEW COMPARISON:  01/29/2008 FINDINGS: Cardiac shadow is within normal limits and stable. Aortic calcifications are again seen with tortuosity. Hiatal hernia is seen as well. Lungs are clear bilaterally. No sizable effusion is noted. No bony abnormality is seen. IMPRESSION: Chronic changes without acute abnormality. Electronically Signed   By: Alcide Clever M.D.   On: 07/14/2015 14:22    EKG: Independently reviewed. Sinus, LBBB. No sign of ACS.   Barclay Lennox J [facility]   If 7PM-7AM, please contact night-coverage [email or web address] Password [facility] 07/14/2015, 5:32 PM

## 2015-07-14 NOTE — ED Notes (Addendum)
Spoke with Cardiology who reports that pt is cleared for d/c on their end.  RN to page admitting to confirm pt d/c

## 2015-07-14 NOTE — ED Notes (Signed)
Cardiology at bedside.

## 2015-07-14 NOTE — Consult Note (Addendum)
Cardiologist: New Reason for Consult: Chest pain Referring Physician: Kenyanna Grzesiak is an 82 y.o. female.  HPI:  Patient is a 82 year old female with history of hypertension, anxiety, glaucoma, carotid artery disease.  She lives at [facility]  They checked her BP and it was over 190 SBP and recommended she go to the ER.  She also complained of coughing up frothy sputum and chest pain. Chest pain resolved after she self-induced vomiting by putting her finger in her mouth.  She is very anxious and has white coat syndrome.  Her PCP stopped her atenolol recently and she has been tachycardic in the ER.      Past Medical History  Diagnosis Date  . Hypertension   . Anxiety   . Glaucoma   . Tinnitus   . Carotid artery disease ([place])   . Osteopenia   . Systolic murmur   . Impaired fasting glucose     Past Surgical History  Procedure Laterality Date  . Cholecystectomy  2002  . Tonsillectomy  1948  . Glaucoma surgery  02/2012    Family History  Problem Relation Age of Onset  . Heart disease Father   . Brain cancer Mother     Social History:  reports that she quit smoking about 36 years ago. She does not have any smokeless tobacco history on file. She reports that she does not drink alcohol. Her drug history is not on file.  Allergies: No Known Allergies  Medications:  Medication Sig  aspirin 81 MG chewable tablet Chew 81 mg by mouth daily.   bimatoprost (LUMIGAN) 0.03 % ophthalmic solution Place 1 drop into both eyes at bedtime.  brinzolamide (AZOPT) 1 % ophthalmic suspension Place 1 drop into both eyes 3 (three) times daily.   Calcium Carb-Cholecalciferol (CALCIUM 600 + D) 600-200 MG-UNIT TABS Take 1 tablet by mouth 2 (two) times daily.   glucose blood test strip 1 each by Other route as needed for other. Use as instructed  hydrochlorothiazide (HYDRODIURIL) 25 MG tablet Take 12.5 mg by mouth daily.   LORazepam (ATIVAN) 0.5 MG tablet Take 0.25 mg by mouth 2  (two) times daily as needed for anxiety.   metoprolol (LOPRESSOR) 25 MG tablet Take 0.5 tablets (12.5 mg total) by mouth 2 (two) times daily.  Multiple Vitamin (MULTIVITAMIN) capsule Take 1 capsule by mouth daily.  pilocarpine (PILOCAR) 4 % ophthalmic solution Place 1 drop into both eyes 4 (four) times daily.   ramipril (ALTACE) 10 MG capsule Take 10 mg by mouth daily.  sertraline (ZOLOFT) 50 MG tablet Take 25 mg by mouth daily.   timolol (BETIMOL) 0.5 % ophthalmic solution Place 1 drop into both eyes 2 (two) times daily.     Results for orders placed or performed during the hospital encounter of 07/14/15 (from the past 48 hour(s))  CBC with Differential     Status: Abnormal   Collection Time: 07/14/15  1:45 PM  Result Value Ref Range   WBC 9.7 4.0 - 10.5 K/uL   RBC 3.82 (L) 3.87 - 5.11 MIL/uL   Hemoglobin 12.0 12.0 - 15.0 g/dL   HCT 36.2 36.0 - 46.0 %   MCV 94.8 78.0 - 100.0 fL   MCH 31.4 26.0 - 34.0 pg   MCHC 33.1 30.0 - 36.0 g/dL   RDW 13.4 11.5 - 15.5 %   Platelets 282 150 - 400 K/uL   Neutrophils Relative % 75 %   Neutro Abs 7.2 1.7 - 7.7 K/uL  Lymphocytes Relative 17 %   Lymphs Abs 1.7 0.7 - 4.0 K/uL   Monocytes Relative 8 %   Monocytes Absolute 0.8 0.1 - 1.0 K/uL   Eosinophils Relative 0 %   Eosinophils Absolute 0.0 0.0 - 0.7 K/uL   Basophils Relative 0 %   Basophils Absolute 0.0 0.0 - 0.1 K/uL  Comprehensive metabolic panel     Status: Abnormal   Collection Time: 07/14/15  1:45 PM  Result Value Ref Range   Sodium 142 135 - 145 mmol/L   Potassium 3.7 3.5 - 5.1 mmol/L   Chloride 105 101 - 111 mmol/L   CO2 19 (L) 22 - 32 mmol/L   Glucose, Bld 135 (H) 65 - 99 mg/dL   BUN 21 (H) 6 - 20 mg/dL   Creatinine, Ser 1.17 (H) 0.44 - 1.00 mg/dL   Calcium 9.4 8.9 - 10.3 mg/dL   Total Protein 7.1 6.5 - 8.1 g/dL   Albumin 4.0 3.5 - 5.0 g/dL   AST 32 15 - 41 U/L   ALT 21 14 - 54 U/L   Alkaline Phosphatase 54 38 - 126 U/L   Total Bilirubin 0.6 0.3 - 1.2 mg/dL   GFR calc non Af  Amer 39 (L) >60 mL/min   GFR calc Af Amer 45 (L) >60 mL/min    Comment: (NOTE) The eGFR has been calculated using the CKD EPI equation. This calculation has not been validated in all clinical situations. eGFR's persistently <60 mL/min signify possible Chronic Kidney Disease.    Anion gap 18 (H) 5 - 15  Lipase, blood     Status: None   Collection Time: 07/14/15  1:45 PM  Result Value Ref Range   Lipase 34 11 - 51 U/L  Brain natriuretic peptide     Status: Abnormal   Collection Time: 07/14/15  1:45 PM  Result Value Ref Range   B Natriuretic Peptide 141.8 (H) 0.0 - 100.0 pg/mL  I-Stat Troponin, ED (not at [facility])     Status: None   Collection Time: 07/14/15  1:49 PM  Result Value Ref Range   Troponin i, poc 0.02 0.00 - 0.08 ng/mL   Comment 3            Comment: Due to the release kinetics of cTnI, a negative result within the first hours of the onset of symptoms does not rule out myocardial infarction with certainty. If myocardial infarction is still suspected, repeat the test at appropriate intervals.     Dg Chest 2 View  07/14/2015  CLINICAL DATA:  Chest pain EXAM: CHEST  2 VIEW COMPARISON:  01/29/2008 FINDINGS: Cardiac shadow is within normal limits and stable. Aortic calcifications are again seen with tortuosity. Hiatal hernia is seen as well. Lungs are clear bilaterally. No sizable effusion is noted. No bony abnormality is seen. IMPRESSION: Chronic changes without acute abnormality. Electronically Signed   By: Inez Catalina M.D.   On: 07/14/2015 14:22    Review of Systems  Constitutional: Negative for fever and diaphoresis.  Respiratory: Positive for cough (frothy white sputum). Negative for shortness of breath.   Cardiovascular: Positive for chest pain (resolved after vomiting). Negative for palpitations, orthopnea and leg swelling.  Gastrointestinal: Negative for nausea, vomiting, abdominal pain, blood in stool and melena.  Genitourinary: Negative for hematuria.    Musculoskeletal: Negative for myalgias.  Neurological: Negative for dizziness.  All other systems reviewed and are negative.  Blood pressure 178/77, pulse 93, temperature 98.3 F (36.8 C), temperature source Oral, resp. rate  19, height '5\' 2"'$  (1.575 m), weight 150 lb (68.04 kg), SpO2 97 %. Physical Exam  Nursing note and vitals reviewed. Constitutional: She appears well-developed and well-nourished. No distress.  HENT:  Head: Normocephalic and atraumatic.  Eyes: EOM are normal. Pupils are equal, round, and reactive to light. No scleral icterus.  Neck: Normal range of motion. Neck supple. No JVD present.  Cardiovascular: Normal rate, regular rhythm, S1 normal and S2 normal.   Murmur heard.  Systolic murmur is present with a grade of 2/6  Pulses:      Radial pulses are 2+ on the right side, and 2+ on the left side.       Dorsalis pedis pulses are 2+ on the right side, and 2+ on the left side.  As far systolic murmur at the RSB  Respiratory: Effort normal and breath sounds normal. No respiratory distress. She has no wheezes. She has no rales.  GI: Soft. Bowel sounds are normal. She exhibits no distension. There is no tenderness.  Musculoskeletal: She exhibits no edema.  Neurological: She is alert. She has normal reflexes. She exhibits normal muscle tone.  Skin: Skin is warm and dry.  Psychiatric: She has a normal mood and affect.    Assessment/Plan: Active Problems:   Essential hypertension   Sinus tachycardia (HCC)   Left bundle branch block (LBBB) on electrocardiogram   Aortic systolic murmur on examination   Chest pain    EKG shows atrial flutter with left bundle branch block, which we think is sinus tach with a left bundle, which is rate related.  Chest pain is atypical and appears to be related to acid reflux.  GI cocktail was given.  Troponin is negative.  Chest x-ray shows chronic changes without acute abnormality.  We gave her IV metoprolol '5mg'$  in the ER to help her BP and  HR.  We will restart her atenolol.  Check an echocardiogram in the office to assess her murmur.  Will arrange follow up.    Tarri Fuller, [facility] 07/14/2015, 5:10 PM   I saw evaluated the patient along with Mr. Samara Snide, [place]. One physical examination and history together. Together we also reviewed her chart and discussed the assessment and plan  Very pleasant 82 year old woman with long-standing hypertension. She's been on atenolol, rare PERRL and HCTZ for hypertension for a long time. Apparently, atenolol was discontinued about a week ago. She has been in the usual state of health without any active symptoms. She is very active denies any chest tightness or pressure with rest or exertion. No PND, orthopnea or edema. No sensation of rapid irregular heartbeats. She was sent to the emergency room from [facility] after they checked her blood pressure noted to be well over 263 systolic. She really was a relatively asymptomatic besides some GERD type of symptoms of frothy sputum and burning sensation. He has a general sensation of having some nausea and heartburn that is usually relieved by trying to make herself throw up. The chest pain resolved when she self-induced vomiting.  Robert emergent, she was hypertensive and tachycardic. Her EKG demonstrates a left bundle branch block which is of uncertain chronicity, however in the absence of crushing substernal chest pain with classic MI features, I doubt this is a new left bundle branch block.  Once her rate was slowed down after being given IV Lopressor, she clearly has less widened QRS complex with P waves. The initial EKG was read as possible atrial flutter. This is simply because the  P waves were hidden in the QRS complexes.    She is clinically stable and no has no active symptoms. No headache or blurred vision or dizziness to suspect that she has accelerated hypertension with end organ damage besides likely hypertensive heart disease that  is chronic.. My general sense is that she basically is having rebound hypertension from having recently stopped her atenolol.  We administered additional 5 mg of Lopressor in the emergency room, we washed her heart rate go down, but her blood pressure still somewhat elevated in the 190/110 range.  As she is a symptomatically, unless we are unable to get her blood pressure below 170, think we can probably let her go home today. My regulation will be for her to start back on the atenolol at a full dose in the evening until her blood pressures at home were normalized. She follows her blood pressures routinely at home. If she is hypotensive my recommendation will be to hold the HCTZ and not beta blocker.  She does have an aortic murmur which sounds like there is probably some degree of aortic stenosis. She is clinically symptomatic, however would be nice to document if there is any significant aortic stenosis. I think we can arrange an outpatient echocardiogram and then for her to see one of [place] in follow-up. My suspicion is that she probably will not need long-term cardiology follow-up unless there is notable issue with the echocardiogram.   I discussed these recommendations with the hospitalist physician who is also consulted. I don't think that her chest discomfort is cardiac in nature as it sounds more GI. She has hypertension, but is not symptomatic from it. Is likely stable to discharge and follow-up with PCP and cardiology after echocardiogram. Needs to restart her atenolol.    Leonie Man, M.D., M.S. Interventional Cardiologist   Pager # [phone] Phone # [phone] [address] [place], [place] [address]

## 2015-07-29 ENCOUNTER — Other Ambulatory Visit: Payer: Self-pay

## 2015-07-29 ENCOUNTER — Ambulatory Visit (HOSPITAL_COMMUNITY): Payer: Medicare Other | Attending: Physician Assistant

## 2015-07-29 DIAGNOSIS — R011 Cardiac murmur, unspecified: Secondary | ICD-10-CM | POA: Diagnosis not present

## 2015-07-29 DIAGNOSIS — I352 Nonrheumatic aortic (valve) stenosis with insufficiency: Secondary | ICD-10-CM | POA: Diagnosis not present

## 2015-07-29 DIAGNOSIS — I119 Hypertensive heart disease without heart failure: Secondary | ICD-10-CM | POA: Diagnosis not present

## 2015-07-29 DIAGNOSIS — Z87891 Personal history of nicotine dependence: Secondary | ICD-10-CM | POA: Diagnosis not present

## 2015-07-29 DIAGNOSIS — Z8249 Family history of ischemic heart disease and other diseases of the circulatory system: Secondary | ICD-10-CM | POA: Insufficient documentation

## 2015-08-10 ENCOUNTER — Ambulatory Visit (INDEPENDENT_AMBULATORY_CARE_PROVIDER_SITE_OTHER): Payer: Medicare Other | Admitting: Cardiology

## 2015-08-10 ENCOUNTER — Encounter: Payer: Self-pay | Admitting: Cardiology

## 2015-08-10 VITALS — BP 170/110 | HR 80 | Ht 62.0 in | Wt 144.0 lb

## 2015-08-10 DIAGNOSIS — I10 Essential (primary) hypertension: Secondary | ICD-10-CM | POA: Diagnosis not present

## 2015-08-10 DIAGNOSIS — I447 Left bundle-branch block, unspecified: Secondary | ICD-10-CM

## 2015-08-10 DIAGNOSIS — R000 Tachycardia, unspecified: Secondary | ICD-10-CM | POA: Diagnosis not present

## 2015-08-10 DIAGNOSIS — I350 Nonrheumatic aortic (valve) stenosis: Secondary | ICD-10-CM

## 2015-08-10 NOTE — Progress Notes (Signed)
PCP: Lillia MountainGRIFFIN,JOHN JOSEPH, MD  Clinic Note: Chief Complaint  Patient presents with  . Follow-up    no chest pain, occassional shortness of breath, occassional edema, no pain or cramping in legs, occassional lightheadedness or dizziness, always fatigue  . Hypertension  . Cardiac Valve Problem    Moderate AS on Echo    HPI: Smith MinceMarilyn Dorning is a 82 y.o. female with a PMH below who presents today for post-ER f/u.Marland Kitchen. She has a history of hypertension, anxiety and glaucoma as well as carotid artery disease but no prior cardiac history. She is [facility] living at [facility].  Smith MinceMarilyn Hegel was last seen on Jul 14, 2015 for in-patient Consultation for Chest Pain / HTN.  Noted to have moderate aortic stenosis on echo ordered for aortic murmur. She had stopped her Atenolol.  Her BP was elevated @ the [facility] RN sent her to the ER. -- was noted to be HTN & Tachycardic with LBBB (probably somewhat rate related). She had reportedly has some chest discomfort that really sound more like abdominal pain that was relieved with vomiting. She essentially induced gagging reflex the major got vomit. Otherwise she did fine with no symptoms. She was discharged in the ER with plans for echocardiogram, she is placed back on her atenolol, but at reduced dose 25 mg.  Recent Hospitalizations: ER Visit 07/14/15.  Studies Reviewed:   2-D echocardiogram from 07/29/2015: EF 55-60%. Normal wall motion. GR 1 DD. High filling pressures. Aortic valve shows moderate stenosis (AVA 1.06-1.14  Cm^2); mild LA dilation.  Interval History: She presents today, for her first post ER visit, without any major complaints. She's not had any headaches, blurred visions, dizziness or wooziness. No chest tightness or pressure with rest or exertion. She also denies any heart failure symptoms of PND, orthopnea or edema. No palpitations, lightheadedness, dizziness, weakness or syncope/near syncope. No TIA/amaurosis  fugax symptoms. No melena, hematochezia, hematuria, or epstaxis. No claudication.  ROS: A comprehensive was performed. Review of Systems  Constitutional: Negative for malaise/fatigue and diaphoresis.  HENT: Negative for nosebleeds.   Eyes: Negative for blurred vision.       Just for age related eye site  Respiratory: Negative for cough and shortness of breath.   Cardiovascular:       Per history of present illness  Gastrointestinal: Negative for heartburn and nausea.       No further induced vomiting  Genitourinary: Negative for dysuria, hematuria and flank pain.       No signs of UTI  Musculoskeletal: Positive for back pain. Negative for myalgias and falls.       She doesn't walk much, and uses a walker  Neurological: Negative for dizziness, speech change, focal weakness, weakness and headaches.  Endo/Heme/Allergies: Negative.   Psychiatric/Behavioral: Negative for memory loss.  All other systems reviewed and are negative.    Past Medical History  Diagnosis Date  . Hypertension   . Anxiety   . Glaucoma   . Tinnitus   . Carotid artery disease (HCC)   . Osteopenia   . Systolic murmur   . Impaired fasting glucose     Past Surgical History  Procedure Laterality Date  . Cholecystectomy  2002  . Tonsillectomy  1948  . Glaucoma surgery  02/2012   Prior to Admission medications   Medication Sig Start Date End Date Taking? Authorizing Provider  aspirin 81 MG chewable tablet Chew 81 mg by mouth daily.    Yes Historical Provider, MD  atenolol (TENORMIN) 25  MG tablet Take 1 tablet (25 mg total) by mouth daily. Take  (2 tablets tonight) and then start  (1tab) every evening before bed 07/14/15  Yes Gwyneth Sprout, MD  bimatoprost (LUMIGAN) 0.03 % ophthalmic solution Place 1 drop into both eyes at bedtime.   Yes Historical Provider, MD  brinzolamide (AZOPT) 1 % ophthalmic suspension Place 1 drop into both eyes 3 (three) times daily.    Yes Historical Provider, MD  Calcium  Carb-Cholecalciferol (CALCIUM 600 + D) 600-200 MG-UNIT TABS Take 1 tablet by mouth 2 (two) times daily.    Yes Historical Provider, MD  hydrochlorothiazide (HYDRODIURIL) 25 MG tablet Take 12.5 mg by mouth daily.    Yes Historical Provider, MD  LORazepam (ATIVAN) 0.5 MG tablet Take 0.25 mg by mouth 2 (two) times daily as needed for anxiety.    Yes Historical Provider, MD  Multiple Vitamin (MULTIVITAMIN) capsule Take 1 capsule by mouth daily.   Yes Historical Provider, MD  pantoprazole (PROTONIX) 40 MG tablet Take 1 tablet by mouth daily. Take 1 tab by mouth daily 08/09/15  Yes Historical Provider, MD  pilocarpine (PILOCAR) 4 % ophthalmic solution Place 1 drop into both eyes 4 (four) times daily.    Yes Historical Provider, MD  ramipril (ALTACE) 10 MG capsule Take 10 mg by mouth daily.   Yes Historical Provider, MD  sertraline (ZOLOFT) 50 MG tablet Take 25 mg by mouth daily.    Yes Historical Provider, MD  timolol (BETIMOL) 0.5 % ophthalmic solution Place 1 drop into both eyes 2 (two) times daily.   Yes Historical Provider, MD   No Known Allergies  Social History   Social History  . Marital Status: Widowed    Spouse Name: N/A  . Number of Children: N/A  . Years of Education: N/A   Social History Main Topics  . Smoking status: Former Smoker    Quit date: 03/22/1979  . Smokeless tobacco: None  . Alcohol Use: No  . Drug Use: None  . Sexual Activity: Not Asked   Other Topics Concern  . None   Social History Narrative   Family History  Problem Relation Age of Onset  . Heart disease Father   . Brain cancer Mother      Wt Readings from Last 3 Encounters:  08/10/15 144 lb (65.318 kg)  07/14/15 150 lb (68.04 kg)    PHYSICAL EXAM BP 170/110 mmHg  Pulse 80  Ht  (1.575 m)  Wt 144 lb (65.318 kg)  BMI 26.33 kg/m2 General appearance: alert, cooperative, appears stated age, no distress and well-nourished, well-groomed. Neck: no adenopathy, no carotid bruit and no JVD Lungs:  clear to auscultation bilaterally, normal percussion bilaterally and non-labored Heart: regular rate and rhythm, S1 & S2 normal, no  click, rub or gallop ; nondisplaced PMI. There is a harsh 3/6 SEM C-D at RUSB radiating to the carotids. Abdomen: soft, non-tender; bowel sounds normal; no masses,  no organomegaly;  Extremities: extremities normal, atraumatic, no cyanosis, or edema Pulses: 2+ and symmetric;  Skin: normal, no abnormal pigmentation, no edema, no lesions noted and temperature normal  Neurologic: Mental status: Alert, oriented, thought content appropriate Cranial nerves: normal (II-XII grossly intact)    Adult ECG Report Not checked  Other studies Reviewed: Additional studies/ records that were reviewed today include:  Recent Labs:   Lab Results  Component Value Date   CREATININE 1.17* 07/14/2015   Lab Results  Component Value Date   K 3.7 07/14/2015  ASSESSMENT / PLAN: Problem List Items Addressed This Visit    Sinus tachycardia (HCC)    Is definitely tachycardic while in the emergency room. I think she did have some rebound effect of being off the atenolol. Better now that she is on her atenolol, but could consider either -25 twice a day or simply taking daily 50 mg.      Moderate aortic stenosis by prior echocardiogram - Primary (Chronic)    89, really don't know how long this aortic stenosis has been progressing. At this point I think we'll simply follow up with another echocardiogram in one year. Provided there is no change, we would continue annual follow-up otherwise would change to 6 months. I don't think she is very interested in surgical repair, we may be also talked about TAVR in the future if it gets worse, but for now I think we'll just at the monitor. Discussed the symptoms of concern for aortic stenosis. For that reason I do want her to stay on a beta blocker, and since she had been stable on atenolol will continue that to avoid any complications.        Relevant Orders   ECHOCARDIOGRAM COMPLETE   Left bundle branch block (LBBB) on electrocardiogram (Chronic)    Thankfully, no significant regional wall motion and amount is noted. He would suspect to see some septal wall motion and amount is, but that was not commented on. I think she did have some widening of the QRS with increased heart rate. That had the ER staff somewhat concerned. No anginal symptoms, therefore would not consider ischemic evaluation as she would not want anything done regardless.      Essential hypertension (Chronic)    Her blood pressure is somewhat difficult control. She certainly needs to be on the beta blocker. I don't want her to be too bradycardic. We may get increase atenolol versus switch to different beta blocker, but for now we'll keep her where she is. She is on HCTZ which I would be leery of continuing to avoid dehydration with aortic stenosis. She is on ACE inhibitor for afterload reduction which is helpful. I would target a blood pressure range of 140-160 mmHg. She brings with her a slip of blood pressures ranging anywhere from 106/66 mmHg up to 144/87 mmHg. Rates are usually in the 60s. I was suspect that some of the elevation of blood pressure today is probably white coat related.  Based on her home readings, I can make any changes.         Current medicines are reviewed at length with the patient today. (+/- concerns) none The following changes have been made: none  NO CHANGE IN CURRENT MEDICATIONS  Studies Ordered:   Orders Placed This Encounter  Procedures  . ECHOCARDIOGRAM COMPLETE  -- SCHEDULE IN MARCH 2018 (LOCATED AT [address]  Follow-up in 12 MONTHS WITH DR Aliz Meritt AFTER ECHO IS COMPLETED.     Marykay Lex, M.D., M.S. Interventional Cardiologist   Pager # [phone] Phone # [phone] [address] [place], [place] [address]

## 2015-08-10 NOTE — Patient Instructions (Signed)
NO CHANGE IN CURRENT MEDICATIONS   SCHEDULE IN MARCH 2018 (LOCATED AT [address]----Your physician has requested that you have an echocardiogram. Echocardiography is a painless test that uses sound waves to create images of your heart. It provides your doctor with information about the size and shape of your heart and how well your heart's chambers and valves are working. This procedure takes approximately one hour. There are no restrictions for this procedure.     Your physician wants you to follow-up in 12 MONTHS WITH DR HARDING AFTER ECHO IS COMPLETED.  You will receive a reminder letter in the mail two months in advance. If you don't receive a letter, please call our office to schedule the follow-up appointment.  If you need a refill on your cardiac medications before your next appointment, please call your pharmacy.

## 2015-08-12 ENCOUNTER — Encounter: Payer: Self-pay | Admitting: Cardiology

## 2015-08-12 DIAGNOSIS — I350 Nonrheumatic aortic (valve) stenosis: Secondary | ICD-10-CM | POA: Insufficient documentation

## 2015-08-12 NOTE — Assessment & Plan Note (Signed)
Her blood pressure is somewhat difficult control. She certainly needs to be on the beta blocker. I don't want her to be too bradycardic. We may get increase atenolol versus switch to different beta blocker, but for now we'll keep her where she is. She is on HCTZ which I would be leery of continuing to avoid dehydration with aortic stenosis. She is on ACE inhibitor for afterload reduction which is helpful. I would target a blood pressure range of 140-160 mmHg. She brings with her a slip of blood pressures ranging anywhere from 106/66 mmHg up to 144/87 mmHg. Rates are usually in the 60s. I was suspect that some of the elevation of blood pressure today is probably white coat related.  Based on her home readings, I can make any changes.

## 2015-08-12 NOTE — Assessment & Plan Note (Signed)
Is definitely tachycardic while in the emergency room. I think she did have some rebound effect of being off the atenolol. Better now that she is on her atenolol, but could consider either -25 twice a day or simply taking daily 50 mg.

## 2015-08-12 NOTE — Assessment & Plan Note (Signed)
Confirmed have moderate aortic stenosis by echocardiogram. This was pretty much what I guess. No symptoms. We discussed the concerning symptoms of aortic stenosis.

## 2015-08-12 NOTE — Assessment & Plan Note (Signed)
89, really don't know how long this aortic stenosis has been progressing. At this point I think we'll simply follow up with another echocardiogram in one year. Provided there is no change, we would continue annual follow-up otherwise would change to 6 months. I don't think she is very interested in surgical repair, we may be also talked about TAVR in the future if it gets worse, but for now I think we'll just at the monitor. Discussed the symptoms of concern for aortic stenosis. For that reason I do want her to stay on a beta blocker, and since she had been stable on atenolol will continue that to avoid any complications.

## 2015-08-12 NOTE — Assessment & Plan Note (Signed)
Thankfully, no significant regional wall motion and amount is noted. He would suspect to see some septal wall motion and amount is, but that was not commented on. I think she did have some widening of the QRS with increased heart rate. That had the ER staff somewhat concerned. No anginal symptoms, therefore would not consider ischemic evaluation as she would not want anything done regardless.

## 2015-08-24 ENCOUNTER — Telehealth: Payer: Self-pay | Admitting: Cardiology

## 2015-08-24 MED ORDER — ATENOLOL 25 MG PO TABS: 25.0000 mg | ORAL_TABLET | Freq: Every day | ORAL | Status: DC

## 2015-08-24 NOTE — Telephone Encounter (Signed)
New Message  Pt stated that there are no refilled on her atenolol- pt wanted to know what to do going forward w/ this med. Please call back and discuss.

## 2015-08-24 NOTE — Telephone Encounter (Signed)
Left message to call back  E-sent medication to pharmacy.

## 2015-08-25 ENCOUNTER — Other Ambulatory Visit: Payer: Self-pay | Admitting: *Deleted

## 2015-08-25 MED ORDER — ATENOLOL 25 MG PO TABS: 25.0000 mg | ORAL_TABLET | Freq: Every day | ORAL | Status: AC

## 2015-08-25 NOTE — Telephone Encounter (Signed)
Spoke to daughter  She states she will pick medications- an call when need to mail to mail order

## 2016-03-16 ENCOUNTER — Emergency Department (HOSPITAL_COMMUNITY): Payer: Medicare Other

## 2016-03-16 ENCOUNTER — Other Ambulatory Visit: Payer: Self-pay

## 2016-03-16 ENCOUNTER — Encounter (HOSPITAL_COMMUNITY): Payer: Self-pay

## 2016-03-16 ENCOUNTER — Inpatient Hospital Stay (HOSPITAL_COMMUNITY)
Admission: EM | Admit: 2016-03-16 | Discharge: 2016-03-18 | DRG: 308 | Disposition: A | Discharge status: Hospice | Payer: Medicare Other | Attending: Cardiology | Admitting: Cardiology

## 2016-03-16 DIAGNOSIS — F0150 Vascular dementia without behavioral disturbance: Secondary | ICD-10-CM | POA: Diagnosis present

## 2016-03-16 DIAGNOSIS — F419 Anxiety disorder, unspecified: Secondary | ICD-10-CM | POA: Diagnosis present

## 2016-03-16 DIAGNOSIS — Z515 Encounter for palliative care: Secondary | ICD-10-CM | POA: Diagnosis present

## 2016-03-16 DIAGNOSIS — J9601 Acute respiratory failure with hypoxia: Secondary | ICD-10-CM

## 2016-03-16 DIAGNOSIS — R000 Tachycardia, unspecified: Secondary | ICD-10-CM | POA: Diagnosis not present

## 2016-03-16 DIAGNOSIS — Z9049 Acquired absence of other specified parts of digestive tract: Secondary | ICD-10-CM | POA: Diagnosis not present

## 2016-03-16 DIAGNOSIS — H9190 Unspecified hearing loss, unspecified ear: Secondary | ICD-10-CM | POA: Diagnosis present

## 2016-03-16 DIAGNOSIS — I350 Nonrheumatic aortic (valve) stenosis: Secondary | ICD-10-CM | POA: Diagnosis present

## 2016-03-16 DIAGNOSIS — Z66 Do not resuscitate: Secondary | ICD-10-CM | POA: Diagnosis present

## 2016-03-16 DIAGNOSIS — M8580 Other specified disorders of bone density and structure, unspecified site: Secondary | ICD-10-CM | POA: Diagnosis present

## 2016-03-16 DIAGNOSIS — Z87891 Personal history of nicotine dependence: Secondary | ICD-10-CM | POA: Diagnosis not present

## 2016-03-16 DIAGNOSIS — J811 Chronic pulmonary edema: Secondary | ICD-10-CM | POA: Diagnosis present

## 2016-03-16 DIAGNOSIS — R011 Cardiac murmur, unspecified: Secondary | ICD-10-CM | POA: Diagnosis present

## 2016-03-16 DIAGNOSIS — I471 Supraventricular tachycardia: Secondary | ICD-10-CM | POA: Diagnosis not present

## 2016-03-16 DIAGNOSIS — I4510 Unspecified right bundle-branch block: Secondary | ICD-10-CM | POA: Diagnosis present

## 2016-03-16 DIAGNOSIS — Z79899 Other long term (current) drug therapy: Secondary | ICD-10-CM | POA: Diagnosis not present

## 2016-03-16 DIAGNOSIS — I484 Atypical atrial flutter: Secondary | ICD-10-CM

## 2016-03-16 DIAGNOSIS — I248 Other forms of acute ischemic heart disease: Secondary | ICD-10-CM | POA: Diagnosis present

## 2016-03-16 DIAGNOSIS — R002 Palpitations: Secondary | ICD-10-CM | POA: Diagnosis present

## 2016-03-16 DIAGNOSIS — I447 Left bundle-branch block, unspecified: Secondary | ICD-10-CM

## 2016-03-16 DIAGNOSIS — Z8249 Family history of ischemic heart disease and other diseases of the circulatory system: Secondary | ICD-10-CM

## 2016-03-16 DIAGNOSIS — H3530 Unspecified macular degeneration: Secondary | ICD-10-CM | POA: Diagnosis present

## 2016-03-16 DIAGNOSIS — Z7982 Long term (current) use of aspirin: Secondary | ICD-10-CM | POA: Diagnosis not present

## 2016-03-16 DIAGNOSIS — I10 Essential (primary) hypertension: Secondary | ICD-10-CM | POA: Diagnosis present

## 2016-03-16 DIAGNOSIS — H409 Unspecified glaucoma: Secondary | ICD-10-CM | POA: Diagnosis present

## 2016-03-16 LAB — COMPREHENSIVE METABOLIC PANEL
ALBUMIN: 3.4 g/dL — AB (ref 3.5–5.0)
ALT: 106 U/L — ABNORMAL HIGH (ref 14–54)
ANION GAP: 10 (ref 5–15)
AST: 134 U/L — ABNORMAL HIGH (ref 15–41)
Alkaline Phosphatase: 213 U/L — ABNORMAL HIGH (ref 38–126)
BUN: 21 mg/dL — ABNORMAL HIGH (ref 6–20)
CHLORIDE: 108 mmol/L (ref 101–111)
CO2: 22 mmol/L (ref 22–32)
Calcium: 9.4 mg/dL (ref 8.9–10.3)
Creatinine, Ser: 1.14 mg/dL — ABNORMAL HIGH (ref 0.44–1.00)
GFR calc Af Amer: 47 mL/min — ABNORMAL LOW (ref >60)
GFR calc non Af Amer: 40 mL/min — ABNORMAL LOW (ref >60)
GLUCOSE: 146 mg/dL — AB (ref 65–99)
POTASSIUM: 3.7 mmol/L (ref 3.5–5.1)
SODIUM: 140 mmol/L (ref 135–145)
Total Bilirubin: 0.5 mg/dL (ref 0.3–1.2)
Total Protein: 6.8 g/dL (ref 6.5–8.1)

## 2016-03-16 LAB — CBC WITH DIFFERENTIAL/PLATELET
BASOS ABS: 0 10*3/uL (ref 0.0–0.1)
Basophils Absolute: 0 10*3/uL (ref 0.0–0.1)
Basophils Relative: 0 %
Basophils Relative: 0 %
EOS PCT: 1 %
Eosinophils Absolute: 0.1 10*3/uL (ref 0.0–0.7)
Eosinophils Absolute: 0 10*3/uL (ref 0.0–0.7)
Eosinophils Relative: 0 %
HCT: 35.9 % — ABNORMAL LOW (ref 36.0–46.0)
HEMATOCRIT: 34.6 % — AB (ref 36.0–46.0)
HEMOGLOBIN: 11.9 g/dL — AB (ref 12.0–15.0)
Hemoglobin: 11.6 g/dL — ABNORMAL LOW (ref 12.0–15.0)
LYMPHS ABS: 2.1 10*3/uL (ref 0.7–4.0)
LYMPHS ABS: 1 10*3/uL (ref 0.7–4.0)
LYMPHS PCT: 9 %
Lymphocytes Relative: 13 %
MCH: 30.1 pg (ref 26.0–34.0)
MCH: 29.9 pg (ref 26.0–34.0)
MCHC: 33.5 g/dL (ref 30.0–36.0)
MCHC: 33.1 g/dL (ref 30.0–36.0)
MCV: 89.6 fL (ref 78.0–100.0)
MCV: 90.2 fL (ref 78.0–100.0)
MONO ABS: 1.3 10*3/uL — AB (ref 0.1–1.0)
MONOS PCT: 8 %
Monocytes Absolute: 1.1 10*3/uL — ABNORMAL HIGH (ref 0.1–1.0)
Monocytes Relative: 9 %
NEUTROS PCT: 82 %
Neutro Abs: 12.9 10*3/uL — ABNORMAL HIGH (ref 1.7–7.7)
Neutro Abs: 9.4 10*3/uL — ABNORMAL HIGH (ref 1.7–7.7)
Neutrophils Relative %: 78 %
PLATELETS: 328 10*3/uL (ref 150–400)
Platelets: 280 10*3/uL (ref 150–400)
RBC: 3.86 MIL/uL — ABNORMAL LOW (ref 3.87–5.11)
RBC: 3.98 MIL/uL (ref 3.87–5.11)
RDW: 14.2 % (ref 11.5–15.5)
RDW: 14 % (ref 11.5–15.5)
WBC: 16.4 10*3/uL — ABNORMAL HIGH (ref 4.0–10.5)
WBC: 11.5 10*3/uL — AB (ref 4.0–10.5)

## 2016-03-16 LAB — TSH
TSH: 2.285 u[IU]/mL (ref 0.350–4.500)
TSH: 1.568 u[IU]/mL (ref 0.350–4.500)

## 2016-03-16 LAB — BASIC METABOLIC PANEL
Anion gap: 11 (ref 5–15)
BUN: 23 mg/dL — AB (ref 6–20)
CALCIUM: 7.9 mg/dL — AB (ref 8.9–10.3)
CO2: 22 mmol/L (ref 22–32)
Chloride: 106 mmol/L (ref 101–111)
Creatinine, Ser: 1.27 mg/dL — ABNORMAL HIGH (ref 0.44–1.00)
GFR calc Af Amer: 41 mL/min — ABNORMAL LOW (ref >60)
GFR, EST NON AFRICAN AMERICAN: 36 mL/min — AB (ref >60)
GLUCOSE: 242 mg/dL — AB (ref 65–99)
Potassium: 4.9 mmol/L (ref 3.5–5.1)
Sodium: 139 mmol/L (ref 135–145)

## 2016-03-16 LAB — I-STAT CHEM 8, ED
BUN: 32 mg/dL — AB (ref 6–20)
CALCIUM ION: 0.99 mmol/L — AB (ref 1.15–1.40)
CHLORIDE: 105 mmol/L (ref 101–111)
Creatinine, Ser: 1.1 mg/dL — ABNORMAL HIGH (ref 0.44–1.00)
GLUCOSE: 246 mg/dL — AB (ref 65–99)
HCT: 35 % — ABNORMAL LOW (ref 36.0–46.0)
Hemoglobin: 11.9 g/dL — ABNORMAL LOW (ref 12.0–15.0)
POTASSIUM: 4.8 mmol/L (ref 3.5–5.1)
SODIUM: 141 mmol/L (ref 135–145)
TCO2: 27 mmol/L (ref 0–100)

## 2016-03-16 LAB — URINALYSIS, ROUTINE W REFLEX MICROSCOPIC
BILIRUBIN URINE: NEGATIVE
Glucose, UA: NEGATIVE mg/dL
Ketones, ur: NEGATIVE mg/dL
Nitrite: POSITIVE — AB
Protein, ur: >300 mg/dL — AB
SPECIFIC GRAVITY, URINE: 1.016 (ref 1.005–1.030)
pH: 6 (ref 5.0–8.0)

## 2016-03-16 LAB — URINE MICROSCOPIC-ADD ON

## 2016-03-16 LAB — MAGNESIUM
Magnesium: 1.5 mg/dL — ABNORMAL LOW (ref 1.7–2.4)
Magnesium: 1.5 mg/dL — ABNORMAL LOW (ref 1.7–2.4)

## 2016-03-16 LAB — T4, FREE: Free T4: 1 ng/dL (ref 0.61–1.12)

## 2016-03-16 LAB — I-STAT TROPONIN, ED: Troponin i, poc: 0.03 ng/mL (ref 0.00–0.08)

## 2016-03-16 LAB — TROPONIN I: Troponin I: 0.03 ng/mL (ref <0.03)

## 2016-03-16 LAB — GLUCOSE, CAPILLARY: GLUCOSE-CAPILLARY: 142 mg/dL — AB (ref 65–99)

## 2016-03-16 LAB — MRSA PCR SCREENING: MRSA BY PCR: NEGATIVE

## 2016-03-16 LAB — PHOSPHORUS: PHOSPHORUS: 4 mg/dL (ref 2.5–4.6)

## 2016-03-16 MED ORDER — LORAZEPAM 0.5 MG PO TABS
0.2500 mg | ORAL_TABLET | ORAL | Status: DC | PRN
  Administered 2016-03-17: 0.25 mg via ORAL
  Filled 2016-03-16: qty 1

## 2016-03-16 MED ORDER — PILOCARPINE HCL 4 % OP SOLN
1.0000 [drp] | Freq: Four times a day (QID) | OPHTHALMIC | Status: DC
  Administered 2016-03-16 – 2016-03-17 (×5): 1 [drp] via OPHTHALMIC
  Filled 2016-03-16: qty 15

## 2016-03-16 MED ORDER — LORAZEPAM 0.5 MG PO TABS
0.2500 mg | ORAL_TABLET | Freq: Every day | ORAL | Status: DC
  Administered 2016-03-16 – 2016-03-17 (×2): 0.25 mg via ORAL
  Filled 2016-03-16 (×2): qty 1

## 2016-03-16 MED ORDER — CALCIUM CHLORIDE 10 % IV SOLN
1.0000 g | Freq: Once | INTRAVENOUS | Status: AC
  Administered 2016-03-16: 1 g via INTRAVENOUS

## 2016-03-16 MED ORDER — LOTEPREDNOL ETABONATE 0.5 % OP SUSP
1.0000 [drp] | Freq: Two times a day (BID) | OPHTHALMIC | Status: DC
  Administered 2016-03-16 – 2016-03-17 (×3): 1 [drp] via OPHTHALMIC
  Filled 2016-03-16: qty 5

## 2016-03-16 MED ORDER — AMIODARONE HCL IN DEXTROSE 360-4.14 MG/200ML-% IV SOLN
30.0000 mg/h | INTRAVENOUS | Status: DC
  Administered 2016-03-16: 30 mg/h via INTRAVENOUS
  Filled 2016-03-16 (×3): qty 200

## 2016-03-16 MED ORDER — AMIODARONE LOAD VIA INFUSION
150.0000 mg | Freq: Once | INTRAVENOUS | Status: AC
  Administered 2016-03-16: 150 mg via INTRAVENOUS
  Filled 2016-03-16: qty 83.34

## 2016-03-16 MED ORDER — HEPARIN SODIUM (PORCINE) 5000 UNIT/ML IJ SOLN
5000.0000 [IU] | Freq: Three times a day (TID) | INTRAMUSCULAR | Status: DC
  Administered 2016-03-16: 5000 [IU] via SUBCUTANEOUS
  Filled 2016-03-16 (×2): qty 1

## 2016-03-16 MED ORDER — ENOXAPARIN SODIUM 30 MG/0.3ML ~~LOC~~ SOLN
30.0000 mg | SUBCUTANEOUS | Status: DC
  Administered 2016-03-16 – 2016-03-17 (×2): 30 mg via SUBCUTANEOUS
  Filled 2016-03-16 (×3): qty 0.3

## 2016-03-16 MED ORDER — MORPHINE SULFATE (PF) 2 MG/ML IV SOLN
INTRAVENOUS | Status: AC
  Administered 2016-03-16: 2 mg via INTRAVENOUS
  Filled 2016-03-16: qty 1

## 2016-03-16 MED ORDER — AMIODARONE HCL IN DEXTROSE 360-4.14 MG/200ML-% IV SOLN
INTRAVENOUS | Status: AC
  Administered 2016-03-16: 59.94 mg/h
  Filled 2016-03-16: qty 200

## 2016-03-16 MED ORDER — AMIODARONE HCL IN DEXTROSE 360-4.14 MG/200ML-% IV SOLN
INTRAVENOUS | Status: AC
  Filled 2016-03-16: qty 200

## 2016-03-16 MED ORDER — SODIUM CHLORIDE 0.9 % IV SOLN: 250.0000 mL | INTRAVENOUS | Status: DC | PRN

## 2016-03-16 MED ORDER — LATANOPROST 0.005 % OP SOLN
1.0000 [drp] | Freq: Every day | OPHTHALMIC | Status: DC
  Administered 2016-03-17: 1 [drp] via OPHTHALMIC
  Filled 2016-03-16: qty 2.5

## 2016-03-16 MED ORDER — HYDROCHLOROTHIAZIDE 25 MG PO TABS
12.5000 mg | ORAL_TABLET | Freq: Every day | ORAL | Status: DC
  Administered 2016-03-16 – 2016-03-17 (×2): 12.5 mg via ORAL
  Filled 2016-03-16: qty 1
  Filled 2016-03-16: qty 0.5

## 2016-03-16 MED ORDER — SODIUM CHLORIDE 0.9 % IV BOLUS (SEPSIS)
1000.0000 mL | Freq: Once | INTRAVENOUS | Status: AC
Start: 2016-03-16 — End: 2016-03-16
  Administered 2016-03-16: 1000 mL via INTRAVENOUS

## 2016-03-16 MED ORDER — ASPIRIN 81 MG PO CHEW
81.0000 mg | CHEWABLE_TABLET | Freq: Every day | ORAL | Status: DC
  Administered 2016-03-16 – 2016-03-17 (×2): 81 mg via ORAL
  Filled 2016-03-16 (×2): qty 1

## 2016-03-16 MED ORDER — TIMOLOL HEMIHYDRATE 0.5 % OP SOLN: 1.0000 [drp] | Freq: Every day | OPHTHALMIC | Status: DC

## 2016-03-16 MED ORDER — FENTANYL CITRATE (PF) 100 MCG/2ML IJ SOLN
100.0000 ug | Freq: Once | INTRAMUSCULAR | Status: AC
  Administered 2016-03-16: 100 ug via INTRAVENOUS

## 2016-03-16 MED ORDER — FUROSEMIDE 10 MG/ML IJ SOLN
INTRAMUSCULAR | Status: AC
  Administered 2016-03-16: 80 mg
  Filled 2016-03-16: qty 8

## 2016-03-16 MED ORDER — SERTRALINE HCL 50 MG PO TABS
75.0000 mg | ORAL_TABLET | Freq: Every day | ORAL | Status: DC
  Administered 2016-03-16 – 2016-03-17 (×2): 75 mg via ORAL
  Filled 2016-03-16: qty 2
  Filled 2016-03-16: qty 1

## 2016-03-16 MED ORDER — SODIUM CHLORIDE 0.9% FLUSH: 3.0000 mL | INTRAVENOUS | Status: DC | PRN

## 2016-03-16 MED ORDER — SODIUM CHLORIDE 0.9% FLUSH
3.0000 mL | Freq: Two times a day (BID) | INTRAVENOUS | Status: DC
  Administered 2016-03-16 – 2016-03-17 (×3): 3 mL via INTRAVENOUS

## 2016-03-16 MED ORDER — DEXTROSE 5 % IV SOLN
INTRAVENOUS | Status: DC | PRN
  Administered 2016-03-16 (×2): 150 mg via INTRAVENOUS

## 2016-03-16 MED ORDER — ATENOLOL 25 MG PO TABS
25.0000 mg | ORAL_TABLET | Freq: Every day | ORAL | Status: DC
  Administered 2016-03-16: 25 mg via ORAL
  Filled 2016-03-16 (×3): qty 1

## 2016-03-16 MED ORDER — TIMOLOL MALEATE 0.5 % OP SOLN
1.0000 [drp] | Freq: Every day | OPHTHALMIC | Status: DC
  Administered 2016-03-16 – 2016-03-17 (×2): 1 [drp] via OPHTHALMIC
  Filled 2016-03-16: qty 5

## 2016-03-16 MED ORDER — SODIUM CHLORIDE 0.9% FLUSH
3.0000 mL | Freq: Two times a day (BID) | INTRAVENOUS | Status: DC
  Administered 2016-03-16: 3 mL via INTRAVENOUS

## 2016-03-16 MED ORDER — PANTOPRAZOLE SODIUM 40 MG PO TBEC
40.0000 mg | DELAYED_RELEASE_TABLET | Freq: Every day | ORAL | Status: DC
  Administered 2016-03-16 – 2016-03-17 (×2): 40 mg via ORAL
  Filled 2016-03-16 (×2): qty 1

## 2016-03-16 MED ORDER — FUROSEMIDE 10 MG/ML IJ SOLN
INTRAMUSCULAR | Status: AC
  Administered 2016-03-16: 40 mg
  Filled 2016-03-16: qty 4

## 2016-03-16 MED ORDER — DIFLUPREDNATE 0.05 % OP EMUL: 1.0000 [drp] | Freq: Two times a day (BID) | OPHTHALMIC | Status: DC

## 2016-03-16 MED ORDER — ONDANSETRON HCL 4 MG/2ML IJ SOLN
4.0000 mg | Freq: Once | INTRAMUSCULAR | Status: AC
  Administered 2016-03-16: 4 mg via INTRAVENOUS

## 2016-03-16 MED ORDER — BRINZOLAMIDE 1 % OP SUSP
1.0000 [drp] | Freq: Two times a day (BID) | OPHTHALMIC | Status: DC
  Administered 2016-03-16 – 2016-03-17 (×4): 1 [drp] via OPHTHALMIC
  Filled 2016-03-16: qty 10

## 2016-03-16 MED ORDER — AMIODARONE HCL IN DEXTROSE 360-4.14 MG/200ML-% IV SOLN
60.0000 mg/h | INTRAVENOUS | Status: AC
  Administered 2016-03-16 (×2): 60 mg/h via INTRAVENOUS
  Filled 2016-03-16: qty 200

## 2016-03-16 MED ORDER — MORPHINE SULFATE (PF) 2 MG/ML IV SOLN
2.0000 mg | INTRAVENOUS | Status: DC | PRN
  Administered 2016-03-17: 2 mg via INTRAVENOUS
  Filled 2016-03-16 (×2): qty 1

## 2016-03-16 MED ORDER — SODIUM BICARBONATE 8.4 % IV SOLN
INTRAVENOUS | Status: DC | PRN
  Administered 2016-03-16: 50 meq via INTRAVENOUS

## 2016-03-16 MED FILL — Medication: Qty: 1 | Status: AC

## 2016-03-16 NOTE — ED Notes (Signed)
Health care power of attorney  And Declaration of a desire for a natural death copied for record

## 2016-03-16 NOTE — ED Notes (Signed)
Spoke with Dr. Anne FuSkains re HR in 60's,. D/C drip per Dr. Anne FuSkains

## 2016-03-16 NOTE — ED Provider Notes (Signed)
[facility]-EMERGENCY DEPT Provider Note   CSN: [id number] Arrival date & time: 03/16/16  2440     History   Chief Complaint Chief Complaint  Patient presents with  . Palpitations    HPI [name] is a 82 y.o. female PMH of HTN, here with palpitations.  Patient states this started in her nursing home and denies any other symptoms. She currently feels back to baseline and has no CP, SOB, or palpitations.  Per EMS, patient was found to be 80 systolic.  HR 190.  En route she was given adenosine, diltiazem, and amiodarone.  Amio slowed her down for a few moments but her HR is currently right back up.  There is no further history.  10 Systems reviewed and are negative for acute change except as noted in the HPI.   HPI  Past Medical History:  Diagnosis Date  . Anxiety   . Carotid artery disease (HCC)   . Glaucoma   . Hypertension   . Impaired fasting glucose   . Osteopenia   . Systolic murmur   . Tinnitus     Patient Active Problem List   Diagnosis Date Noted  . SVT (supraventricular tachycardia) (HCC) 03/16/2016  . Moderate aortic stenosis by prior echocardiogram 08/12/2015  . Chest pain 07/14/2015  . Left bundle branch block (LBBB) on electrocardiogram 07/14/2015  . Aortic systolic murmur on examination 07/14/2015  . Abnormal EKG   . Essential hypertension   . Sinus tachycardia   . Dysphagia     Past Surgical History:  Procedure Laterality Date  . CHOLECYSTECTOMY  2002  . GLAUCOMA SURGERY  02/2012  . TONSILLECTOMY  1948    OB History    No data available       Home Medications    Prior to Admission medications   Medication Sig Start Date End Date Taking? Authorizing Provider  aspirin 81 MG chewable tablet Chew 81 mg by mouth daily.    Yes Historical Provider, MD  atenolol (TENORMIN) 25 MG tablet Take 1 tablet (25 mg total) by mouth at bedtime. Patient taking differently: Take 12.5 mg by mouth at bedtime.  08/25/15  Yes Marykay Lex, MD  bimatoprost  (LUMIGAN) 0.03 % ophthalmic solution Place 1 drop into both eyes at bedtime.   Yes Historical Provider, MD  brinzolamide (AZOPT) 1 % ophthalmic suspension Place 1 drop into both eyes 2 (two) times daily.    Yes Historical Provider, MD  Calcium Carb-Cholecalciferol (CALCIUM 600 + D) 600-200 MG-UNIT TABS Take 1 tablet by mouth 2 (two) times daily.    Yes Historical Provider, MD  Difluprednate (DUREZOL) 0.05 % EMUL Place 1 drop into the left eye 2 (two) times daily.   Yes Historical Provider, MD  hydrochlorothiazide (HYDRODIURIL) 25 MG tablet Take 12.5 mg by mouth daily.    Yes Historical Provider, MD  LORazepam (ATIVAN) 0.5 MG tablet Take 0.25 mg by mouth daily.    Yes Historical Provider, MD  Multiple Vitamin (MULTIVITAMIN WITH MINERALS) TABS tablet Take 1 tablet by mouth daily.   Yes Historical Provider, MD  Multiple Vitamin (MULTIVITAMIN) capsule Take 1 capsule by mouth daily.   Yes Historical Provider, MD  pantoprazole (PROTONIX) 40 MG tablet Take 1 tablet by mouth daily.  08/09/15  Yes Historical Provider, MD  pilocarpine (PILOCAR) 4 % ophthalmic solution Place 1 drop into both eyes 4 (four) times daily.    Yes Historical Provider, MD  Polyethyl Glycol-Propyl Glycol (SYSTANE) 0.4-0.3 % SOLN Apply 1 drop to eye 4 (  four) times daily as needed (for dry eyes).   Yes Historical Provider, MD  ramipril (ALTACE) 10 MG capsule Take 10 mg by mouth daily.   Yes Historical Provider, MD  sertraline (ZOLOFT) 50 MG tablet Take 75 mg by mouth daily.    Yes Historical Provider, MD  timolol (BETIMOL) 0.5 % ophthalmic solution Place 1 drop into both eyes daily.    Yes Historical Provider, MD    Family History Family History  Problem Relation Age of Onset  . Heart disease Father   . Brain cancer Mother     Social History Social History  Substance Use Topics  . Smoking status: Former Smoker    Quit date: 03/22/1979  . Smokeless tobacco: Not on file  . Alcohol use No     Allergies   Review of patient's  allergies indicates no known allergies.   Review of Systems Review of Systems   Physical Exam Updated Vital Signs BP 134/64   Pulse 76   Resp 20   Ht 5\' 2"  (1.575 m)   Wt 135 lb (61.2 kg)   SpO2 98%   BMI 24.69 kg/m   Physical Exam  Constitutional: She is oriented to person, place, and time. She appears well-developed and well-nourished. No distress.  HENT:  Head: Normocephalic and atraumatic.  Nose: Nose normal.  Mouth/Throat: Oropharynx is clear and moist. No oropharyngeal exudate.  Eyes: Conjunctivae and EOM are normal. Pupils are equal, round, and reactive to light. No scleral icterus.  Neck: Normal range of motion. Neck supple. No JVD present. No tracheal deviation present. No thyromegaly present.  Cardiovascular: Regular rhythm and normal heart sounds.  Exam reveals no gallop and no friction rub.   No murmur heard. tachycardic  Pulmonary/Chest: Effort normal and breath sounds normal. No respiratory distress. She has no wheezes. She exhibits no tenderness.  Abdominal: Soft. Bowel sounds are normal. She exhibits no distension and no mass. There is no tenderness. There is no rebound and no guarding.  Musculoskeletal: Normal range of motion. She exhibits no edema or tenderness.  Lymphadenopathy:    She has no cervical adenopathy.  Neurological: She is alert and oriented to person, place, and time. No cranial nerve deficit. She exhibits normal muscle tone.  Skin: Skin is warm and dry. No rash noted. No erythema. No pallor.  Nursing note and vitals reviewed.    ED Treatments / Results  Labs (all labs ordered are listed, but only abnormal results are displayed) Labs Reviewed  CBC WITH DIFFERENTIAL/PLATELET - Abnormal; Notable for the following:       Result Value   WBC 16.4 (*)    RBC 3.86 (*)    Hemoglobin 11.6 (*)    HCT 34.6 (*)    Neutro Abs 12.9 (*)    Monocytes Absolute 1.3 (*)    All other components within normal limits  BASIC METABOLIC PANEL - Abnormal;  Notable for the following:    Glucose, Bld 242 (*)    BUN 23 (*)    Creatinine, Ser 1.27 (*)    Calcium 7.9 (*)    GFR calc non Af Amer 36 (*)    GFR calc Af Amer 41 (*)    All other components within normal limits  MAGNESIUM - Abnormal; Notable for the following:    Magnesium 1.5 (*)    All other components within normal limits  TROPONIN I - Abnormal; Notable for the following:    Troponin I 0.03 (*)    All other components  within normal limits  I-STAT CHEM 8, ED - Abnormal; Notable for the following:    BUN 32 (*)    Creatinine, Ser 1.10 (*)    Glucose, Bld 246 (*)    Calcium, Ion 0.99 (*)    Hemoglobin 11.9 (*)    HCT 35.0 (*)    All other components within normal limits  TSH  T4, FREE  I-STAT TROPOININ, ED    EKG  EKG Interpretation None       Radiology Dg Chest Port 1 View  Result Date: 03/16/2016 CLINICAL DATA:  82 year old female with shortness of breath and palpitation EXAM: PORTABLE CHEST 1 VIEW COMPARISON:  Chest radiograph dated 07/14/2015 FINDINGS: Mild diffuse interstitial prominence and nodularity may represent a degree of edema. Left lung base linear and streaky densities may be related to crowding of the vasculature or atelectasis/ scarring. Infiltrate is less likely. There is no focal consolidation, pleural effusion, or pneumothorax. Stable cardiac silhouette. Atherosclerotic calcification of the aorta. No acute osseous pathology. IMPRESSION: Chronic changes with possible mild interstitial edema. Left lung base atelectatic changes. Infiltrate is less likely. Electronically Signed   By: Elgie Collard M.D.   On: 03/16/2016 05:34    Procedures .Cardioversion Date/Time: 03/16/2016 6:57 AM Performed by: Tomasita Crumble Authorized by: Tomasita Crumble   Consent:    Consent obtained:  Verbal   Consent given by:  Patient   Risks discussed:  Pain, induced arrhythmia and death   Alternatives discussed:  Rate-control medication and alternative  treatment Pre-procedure details:    Cardioversion basis:  Emergent   Rhythm:  Atrial flutter   Electrode placement:  Anterior-posterior Attempt one:    Cardioversion mode:  Synchronous   Waveform:  Biphasic   Shock (Joules):  200   Shock outcome:  No change in rhythm Attempt two:    Cardioversion mode:  Synchronous   Waveform:  Biphasic   Shock (Joules):  200   Shock outcome:  No change in rhythm Post-procedure details:    Patient status:  Alert   Patient tolerance of procedure:  Tolerated well, no immediate complications   (including critical care time)  Medications Ordered in ED Medications  sodium bicarbonate injection (50 mEq Intravenous Given 03/16/16 0430)  amiodarone (CORDARONE) 150 mg in dextrose 5 % 100 mL bolus ( Intravenous Stopped 03/16/16 0516)  fentaNYL (SUBLIMAZE) injection 100 mcg (100 mcg Intravenous Given 03/16/16 0430)  sodium chloride 0.9 % bolus 1,000 mL (1,000 mLs Intravenous New Bag/Given 03/16/16 0427)  amiodarone (NEXTERONE PREMIX) 360-4.14 MG/200ML-% (1.8 mg/mL) IV infusion (59.94 mg/hr  New Bag/Given 03/16/16 0459)  ondansetron (ZOFRAN) injection 4 mg (4 mg Intravenous Given 03/16/16 0449)  calcium chloride injection 1 g (1 g Intravenous Given 03/16/16 0432)     Initial Impression / Assessment and Plan / ED Course  I have reviewed the triage vital signs and the nursing notes.  Pertinent labs & imaging results that were available during my care of the patient were reviewed by me and considered in my medical decision making (see chart for details).  Clinical Course    Patient presents to the ED for palpitations.  Although asymptomatic she continues to have HR of 190.  I elected to shock her since all other medications did not work.  She was shocked twice at 200J with only momentary relief.  She was then given another amio bolus and placed on a drip.  I spoke with Dr. Mayford Knife who will evaluate the patient in the ED.  She continues to have  a HR steady at  162.  CRITICAL CARE Performed by: Tomasita CrumbleNI,Levone Otten   Total critical care time: 50 minutes - flutter with RVR requiring cardioversion  Critical care time was exclusive of separately billable procedures and treating other patients.  Critical care was necessary to treat or prevent imminent or life-threatening deterioration.  Critical care was time spent personally by me on the following activities: development of treatment plan with patient and/or surrogate as well as nursing, discussions with consultants, evaluation of patient's response to treatment, examination of patient, obtaining history from patient or surrogate, ordering and performing treatments and interventions, ordering and review of laboratory studies, ordering and review of radiographic studies, pulse oximetry and re-evaluation of patient's condition.     Final Clinical Impressions(s) / ED Diagnoses   Final diagnoses:  Atypical atrial flutter [facility](HCC)    New Prescriptions New Prescriptions   No medications on file     Tomasita CrumbleAdeleke Sarahgrace Broman, MD 03/16/16 [id number]

## 2016-03-16 NOTE — Consult Note (Signed)
ELECTROPHYSIOLOGY CONSULT NOTE    Patient ID: [name] MRN: [id number], DOB/AGE: November 14, 1920 82 y.o.  Admit date: 03/16/2016 Date of Consult: 03/16/2016  Primary Physician: Lillia Mountain, MD Primary Cardiologist: Herbie Baltimore Referring MD: Mayford Knife  Reason for Consultation: atrial arrhythmias  HPI:  [name] is a 82 y.o. female with a past medical history significant for hypertension, moderate AS, hypertension, carotid artery disease, and being hard of hearing. She is DNR/DNI.  She developed palpitations yesterday associated with shortness of breath and presented to the ER for further evaluation. She was placed on IV amiodarone with restoration of SR after unsuccessful cardioversion.  She has been doing relatively well lately with the exception of some urinary symptoms.    Echo 07/2015 demonstrated EF 55-60%, grade 1 diastolic dysfunction, moderate AS, LA 41.  She currently states she wants to go home. She denies chest pain. Shortness of breath at baseline. No recent fevers/chills.   Past Medical History:  Diagnosis Date  . Anxiety   . Carotid artery disease (HCC)   . Glaucoma   . Hypertension   . Impaired fasting glucose   . Osteopenia   . Systolic murmur   . Tinnitus      Surgical History:  Past Surgical History:  Procedure Laterality Date  . CHOLECYSTECTOMY  2002  . GLAUCOMA SURGERY  02/2012  . TONSILLECTOMY  1948     Prescriptions Prior to Admission  Medication Sig Dispense Refill Last Dose  . aspirin 81 MG chewable tablet Chew 81 mg by mouth daily.    03/15/2016 at Unknown time  . atenolol (TENORMIN) 25 MG tablet Take 1 tablet (25 mg total) by mouth at bedtime. (Patient taking differently: Take 12.5 mg by mouth at bedtime. ) 30 tablet 6 03/15/2016 at 2200  . bimatoprost (LUMIGAN) 0.03 % ophthalmic solution Place 1 drop into both eyes at bedtime.   03/15/2016 at Unknown time  . brinzolamide (AZOPT) 1 % ophthalmic suspension Place 1 drop into both  eyes 2 (two) times daily.    03/15/2016 at Unknown time  . Calcium Carb-Cholecalciferol (CALCIUM 600 + D) 600-200 MG-UNIT TABS Take 1 tablet by mouth 2 (two) times daily.    03/15/2016 at Unknown time  . Difluprednate (DUREZOL) 0.05 % EMUL Place 1 drop into the left eye 2 (two) times daily.   03/15/2016 at Unknown time  . hydrochlorothiazide (HYDRODIURIL) 25 MG tablet Take 12.5 mg by mouth daily.    03/15/2016 at Unknown time  . LORazepam (ATIVAN) 0.5 MG tablet Take 0.25 mg by mouth daily.    03/15/2016 at Unknown time  . Multiple Vitamin (MULTIVITAMIN WITH MINERALS) TABS tablet Take 1 tablet by mouth daily.   03/15/2016 at Unknown time  . Multiple Vitamin (MULTIVITAMIN) capsule Take 1 capsule by mouth daily.   03/15/2016 at Unknown time  . pantoprazole (PROTONIX) 40 MG tablet Take 1 tablet by mouth daily.    03/15/2016 at Unknown time  . pilocarpine (PILOCAR) 4 % ophthalmic solution Place 1 drop into both eyes 4 (four) times daily.    03/15/2016 at Unknown time  . Polyethyl Glycol-Propyl Glycol (SYSTANE) 0.4-0.3 % SOLN Apply 1 drop to eye 4 (four) times daily as needed (for dry eyes).   03/15/2016 at Unknown time  . ramipril (ALTACE) 10 MG capsule Take 10 mg by mouth daily.   03/15/2016 at Unknown time  . sertraline (ZOLOFT) 50 MG tablet Take 75 mg by mouth daily.    03/15/2016 at Unknown time  . timolol (BETIMOL) 0.5 %  ophthalmic solution Place 1 drop into both eyes daily.    03/15/2016 at Unknown time    Inpatient Medications:  . amiodarone      . aspirin  81 mg Oral Daily  . atenolol  25 mg Oral QHS  . brinzolamide  1 drop Both Eyes BID  . heparin  5,000 Units Subcutaneous Q8H  . hydrochlorothiazide  12.5 mg Oral Daily  . latanoprost  1 drop Both Eyes QHS  . LORazepam  0.25 mg Oral Daily  . loteprednol  1 drop Left Eye BID  . morphine      . pantoprazole  40 mg Oral Daily  . pilocarpine  1 drop Both Eyes QID  . sertraline  75 mg Oral Daily  . sodium chloride flush  3 mL Intravenous  Q12H  . sodium chloride flush  3 mL Intravenous Q12H  . timolol  1 drop Both Eyes Daily    Allergies: No Known Allergies  Social History   Social History  . Marital status: Widowed    Spouse name: N/A  . Number of children: N/A  . Years of education: N/A   Occupational History  . Not on file.   Social History Main Topics  . Smoking status: Former Smoker    Quit date: 03/22/1979  . Smokeless tobacco: Not on file  . Alcohol use No  . Drug use: Unknown  . Sexual activity: Not on file   Other Topics Concern  . Not on file   Social History Narrative  . No narrative on file     Family History  Problem Relation Age of Onset  . Heart disease Father   . Brain cancer Mother      Review of Systems: All other systems reviewed and are otherwise negative except as noted above.  Physical Exam: Vitals:   03/16/16 0800 03/16/16 0846 03/16/16 0900 03/16/16 0946  BP:   (!) 190/74 (!) 190/74  Pulse:   98 (!) 154  Resp:   (!) 21 (!) 35  Temp:  98.1 F (36.7 C)    TempSrc:  Oral    SpO2:   92% 94%  Weight: 137 lb 5.6 oz (62.3 kg)     Height: 5\' 2"  (1.575 m)       GEN- The patient is elderly appearing, alert and oriented x 3 today, hard of hearing.   HEENT: normocephalic, atraumatic; sclera clear, conjunctiva pink; hearing intact; oropharynx clear; neck supple Lungs- Clear to ausculation bilaterally, normal work of breathing.  No wheezes, rales, rhonchi Heart- Regular rate and rhythm, 2/6 SEM GI- soft, non-tender, non-distended, bowel sounds present Extremities- no clubbing, cyanosis, 1+ BLE edema MS- no significant deformity or atrophy Skin- warm and dry, no rash or lesion Psych- euthymic mood, full affect Neuro- strength and sensation are intact  Labs:   Lab Results  Component Value Date   WBC 11.5 (H) 03/16/2016   HGB 11.9 (L) 03/16/2016   HCT 35.9 (L) 03/16/2016   MCV 90.2 03/16/2016   PLT 280 03/16/2016     Recent Labs Lab 03/16/16 0905  NA 140  K 3.7    CL 108  CO2 22  BUN 21*  CREATININE 1.14*  CALCIUM 9.4  PROT 6.8  BILITOT 0.5  ALKPHOS 213*  ALT 106*  AST 134*  GLUCOSE 146*      Radiology/Studies: Dg Chest Port 1 View Result Date: 03/16/2016 CLINICAL DATA:  82 year old female with shortness of breath and palpitation EXAM: PORTABLE CHEST 1 VIEW COMPARISON:  Chest radiograph dated 07/14/2015 FINDINGS: Mild diffuse interstitial prominence and nodularity may represent a degree of edema. Left lung base linear and streaky densities may be related to crowding of the vasculature or atelectasis/ scarring. Infiltrate is less likely. There is no focal consolidation, pleural effusion, or pneumothorax. Stable cardiac silhouette. Atherosclerotic calcification of the aorta. No acute osseous pathology. IMPRESSION: Chronic changes with possible mild interstitial edema. Left lung base atelectatic changes. Infiltrate is less likely. Electronically Signed   By: Elgie Collard M.D.   On: 03/16/2016 05:34    EKG: atypical atrial flutter vs atrial tachycardia, rate 164  TELEMETRY: sinus rhythm with intermittent atrial flutter  Assessment/Plan; 1. Atypical atrial flutter vs atrial tachycardia  She has RVR and associated shortness of breath.  Agree with IV amiodarone for now, would try to convert to po tomorrow Her shortness of breath is improved in SR CHADS2VASC is 4. Will defer anticoagulation for now She is not a candidate for EP procedures  2.  Shortness of breath/acute on chronic diastolic heart failure Likely related to flutter Improved with sinus and IV Lasix  3. Aortic stenosis Moderate by echo 07/2015  4.  Dysuria Will send urine culture  Will ask palliative care to see. She is clear that she does not want BiPap, intubation.  Will give prn morphine for now for symptom relief of shortness of breath. General cardiology to follow while here. EP to see as needed.    Signed, Gypsy Balsam, NP 03/16/2016 11:06 AM  I have seen,  examined the patient, and reviewed the above assessment and plan. On exam, ill appearing.  Dyspneic and volume overloaded.  Likely driven by atypical atrial flutter with RVR.  Changes to above are made where necessary.  She is quite ill and clear that she does not wish to have extensive efforts performed.  She is DNR/DNI which is appropriate based on my conversations with her daughter.  I have given additional lasix.  As she does not wish to have bipap on, will give O2 and use morphine when needed for air hunger. Palliative care consultation placed.  This is a very ill female at very high risk for decompensation/ death.  The patient is critically ill and requires high complexity decision making for assessment and support, frequent evaluation and titration of therapies, application of advanced monitoring technologies and extensive interpretation of multiple databases.   Total CCT spent directly with the patient today is 30 minutes    Co Sign: Hillis Range, MD 03/16/2016 12:48 PM

## 2016-03-16 NOTE — Progress Notes (Signed)
Palliative Medicine consult noted. Due to high referral volume, there may be a delay seeing this patient. Please call the Palliative Medicine Team office at [phone] if recommendations are needed in the interim.  Thank you for inviting us to see this patient.  Margret ChanceMelanie G. Maika Mcelveen, RN, BSN, [facility] 03/16/2016 11:27 AM Cell [phone] 8:00-4:00 Monday-Friday Office [phone]

## 2016-03-16 NOTE — Code Documentation (Signed)
Pt shocked with 200j

## 2016-03-16 NOTE — ED Triage Notes (Signed)
[facility] Pt got up at 11:30pm with palpations. Pt original HR when EMS arrived [address] 6mg  Adenoasine, 10mg  Cardizem, 150mg  Amiodarone, 500mL NS. Pt HR came down to 100. Pt A&O x4.

## 2016-03-16 NOTE — ED Notes (Signed)
[facility] has not approved bed assignment, they will return call to ED for report

## 2016-03-16 NOTE — Consult Note (Signed)
Consultation Note Date: 03/16/2016   Patient Name: [name]  DOB: 08-26-1920  MRN: [id number]  Age / Sex: 82 y.o., female  PCP: Kirby Funk, MD Referring Physician: Marykay Lex, MD  Reason for Consultation: Establishing goals of care and Hospice Evaluation  HPI/Patient Profile: 82 y.o. female  with past medical history of moderate AS, mild vascular dementia, macular degeneration, hearing loss and intermittent tachycardias admitted on 03/16/2016 with HR 180's -A flutter, converted with Amiodarone and DCCV. Developed subsequent pulmonary edema requiring bipap-now in ICU refusing bipap and upset that she "was resuscitated" and stating she wants to go home. RN called palliative to see patient as a priority visit.  I saw [name] who was resting comfortably and peacefully when I arrived in her room- since the early morning hours of distress she has diuresed over a liter and is on nasal cannula, not hypoxic, received only one dose of 2mg  IV morphine at 11AM -does not appear at this time to need bipap and is basically comfortable and asserting her desire to go home. Poor short term memory but oriented to person and place and situation on my visit.  I spoke with her daughter and [name] over the phone. Her daughter supports her mothers wish for QOL- they have had prior good experiences with palliative care and hospice with other family members. Her mother lives at [facility]. She notes decline especially over the past 6 months but in general says that her mother has done very well compared to her peers.   Clinical Assessment and Goals of Care:  1. They are AGREEABLE and OPEN to having a Hospice Referral made for support after discharge- this is very appropriate given stated goals of care for comfort QOL and to not prolong her life in her current condition and in setting on  progressive decline.   2. I clarified DNR does not mean do not treat and that Atrial tachycardias can be particularly uncomfortable and cause distress especially a sense of impending doom or panic- so treating reversible rhythms can be an important part of comfort- moving forward we discussed how to handle this symptom if it occurs outside the hospital using Hospice care and symptom management with morphine and anxiolytics. She is very adamant that she does not desire re-hospitalization.  3. Her prognosis is <6 months given advanced age, moderate AS and refractory tachycardia.   4. Recommend transitioning to oral amiodarone in the AM and discharging home to [place] with hospice care. Continue diuretics for comfort PRN.  5. NO Bipap, minimize invasive interventions, treat symptoms as primary goal for her care  6. Minimize PM interuptions and allow for sleep    SUMMARY OF RECOMMENDATIONS   Code Status/Advance Care Planning:  DNR    Symptom Management:   Roxanol 10mg  SL q2 PRN for dyspnea, anxiety and distress Dispense: 30ml ON DISCHARGE- ok to continue IV morphine PRN as ordered while inpatient  Hospice referral  Palliative Prophylaxis:   Delirium Protocol and Frequent Pain Assessment  Additional  Recommendations (Limitations, Scope, Preferences):  Avoid Hospitalization and Minimize Medications  Psycho-social/Spiritual:   Desire for further Chaplaincy support:no  Additional Recommendations: Caregiving  Support/Resources and Education on Hospice  Prognosis:   < 6 months  Discharge Planning: Home with Hospice      Primary Diagnoses: Present on Admission: . SVT (supraventricular tachycardia) (HCC)   I have reviewed the medical record, interviewed the patient and family, and examined the patient. The following aspects are pertinent.  Past Medical History:  Diagnosis Date  . Anxiety   . Carotid artery disease (HCC)   . Glaucoma   . Hypertension   . Impaired  fasting glucose   . Osteopenia   . Systolic murmur   . Tinnitus    Social History   Social History  . Marital status: Widowed    Spouse name: N/A  . Number of children: N/A  . Years of education: N/A   Social History Main Topics  . Smoking status: Former Smoker    Quit date: 03/22/1979  . Smokeless tobacco: None  . Alcohol use No  . Drug use: Unknown  . Sexual activity: Not Asked   Other Topics Concern  . None   Social History Narrative  . None   Family History  Problem Relation Age of Onset  . Heart disease Father   . Brain cancer Mother    Scheduled Meds: . amiodarone      . aspirin  81 mg Oral Daily  . atenolol  25 mg Oral QHS  . brinzolamide  1 drop Both Eyes BID  . heparin  5,000 Units Subcutaneous Q8H  . hydrochlorothiazide  12.5 mg Oral Daily  . latanoprost  1 drop Both Eyes QHS  . LORazepam  0.25 mg Oral Daily  . loteprednol  1 drop Left Eye BID  . pantoprazole  40 mg Oral Daily  . pilocarpine  1 drop Both Eyes QID  . sertraline  75 mg Oral Daily  . sodium chloride flush  3 mL Intravenous Q12H  . sodium chloride flush  3 mL Intravenous Q12H  . timolol  1 drop Both Eyes Daily   Continuous Infusions: . amiodarone (NEXTERONE) IV bolus only 150 mg/100 mL Stopped (03/16/16 0516)  . amiodarone 60 mg/hr (03/16/16 1303)   Followed by  . amiodarone     PRN Meds:.sodium chloride, amiodarone (NEXTERONE) IV bolus only 150 mg/100 mL, morphine injection, sodium bicarbonate, sodium chloride flush Medications Prior to Admission:  Prior to Admission medications   Medication Sig Start Date End Date Taking? Authorizing Provider  aspirin 81 MG chewable tablet Chew 81 mg by mouth daily.    Yes Historical Provider, MD  atenolol (TENORMIN) 25 MG tablet Take 1 tablet (25 mg total) by mouth at bedtime. Patient taking differently: Take 12.5 mg by mouth at bedtime.  08/25/15  Yes Marykay Lex, MD  bimatoprost (LUMIGAN) 0.03 % ophthalmic solution Place 1 drop into both  eyes at bedtime.   Yes Historical Provider, MD  brinzolamide (AZOPT) 1 % ophthalmic suspension Place 1 drop into both eyes 2 (two) times daily.    Yes Historical Provider, MD  Calcium Carb-Cholecalciferol (CALCIUM 600 + D) 600-200 MG-UNIT TABS Take 1 tablet by mouth 2 (two) times daily.    Yes Historical Provider, MD  Difluprednate (DUREZOL) 0.05 % EMUL Place 1 drop into the left eye 2 (two) times daily.   Yes Historical Provider, MD  hydrochlorothiazide (HYDRODIURIL) 25 MG tablet Take 12.5 mg by mouth daily.    Yes  Historical Provider, MD  LORazepam (ATIVAN) 0.5 MG tablet Take 0.25 mg by mouth daily.    Yes Historical Provider, MD  Multiple Vitamin (MULTIVITAMIN WITH MINERALS) TABS tablet Take 1 tablet by mouth daily.   Yes Historical Provider, MD  Multiple Vitamin (MULTIVITAMIN) capsule Take 1 capsule by mouth daily.   Yes Historical Provider, MD  pantoprazole (PROTONIX) 40 MG tablet Take 1 tablet by mouth daily.  08/09/15  Yes Historical Provider, MD  pilocarpine (PILOCAR) 4 % ophthalmic solution Place 1 drop into both eyes 4 (four) times daily.    Yes Historical Provider, MD  Polyethyl Glycol-Propyl Glycol (SYSTANE) 0.4-0.3 % SOLN Apply 1 drop to eye 4 (four) times daily as needed (for dry eyes).   Yes Historical Provider, MD  ramipril (ALTACE) 10 MG capsule Take 10 mg by mouth daily.   Yes Historical Provider, MD  sertraline (ZOLOFT) 50 MG tablet Take 75 mg by mouth daily.    Yes Historical Provider, MD  timolol (BETIMOL) 0.5 % ophthalmic solution Place 1 drop into both eyes daily.    Yes Historical Provider, MD   No Known Allergies Review of Systems  Physical Exam  Vital Signs: BP (!) 150/88   Pulse 86   Temp 98.4 F (36.9 C) (Oral)   Resp (!) 22   Ht 5\' 2"  (1.575 m)   Wt 62.3 kg (137 lb 5.6 oz)   SpO2 90%   BMI 25.12 kg/m  Pain Assessment: No/denies pain   Pain Score: Asleep   SpO2: SpO2: 90 % O2 Device:SpO2: 90 % O2 Flow Rate: .O2 Flow Rate (L/min): 4 L/min  IO:  Intake/output summary:  Intake/Output Summary (Last 24 hours) at 03/16/16 1539 Last data filed at 03/16/16 1400  Gross per 24 hour  Intake           209.38 ml  Output             1300 ml  Net         -1090.62 ml    LBM: Last BM Date: 03/16/16 Baseline Weight: Weight: 61.2 kg (135 lb) Most recent weight: Weight: 62.3 kg (137 lb 5.6 oz)     Palliative Assessment/Data:50%     Time In: 2:45 Time Out: 4PM Time Total: 75 min Greater than 50%  of this time was spent counseling and coordinating care related to the above assessment and plan.  Signed by: Anderson MaltaGOLDING,ELIZABETH, DO   Please contact Palliative Medicine Team phone at [phone] for questions and concerns.  For individual provider: See Loretha StaplerAmion

## 2016-03-16 NOTE — ED Notes (Signed)
Report given to [place], RN 66M

## 2016-03-16 NOTE — ED Notes (Signed)
Pt asking for daughter, [facility]

## 2016-03-16 NOTE — ED Notes (Signed)
Pts daughter at bedside  

## 2016-03-16 NOTE — H&P (Addendum)
Admit date: 03/16/2016 Referring Physician: Dr. Mora Bellmanni Primary Cardiologist: Dr. [name] Chief complaint/reason for admission:tachycardia  HPI: This is a 82 yo female who is very hard of hearing . She is followed by Dr. [name] for Carotid artery disease, HTN and moderate AS on echo 07/2015 at which time her LVF was normal.  She also has significant anxiety according to her daughter.  SHe was in her USOH until she was awakened early this am with palpitations.  She got very anxious and called EMS.  On arrival she was in a wide complex tachycardia (she has a known chronic LBBB).  She was given adenosine 6mg  which per EMS broke the rhythm for a few seconds and then she went back into it.  She was given Cardizem with no improvement and then was given Amio bolus  In ER her HR was 160bpm and the ER MD sedated her and tried cardioversion twice without any success.  She denies any chest pain or pressure, SOB, DOE, LE edema, fever, chills, cough, diarrhea, N/V.  She did call her PCP yesterday and submit a urine sample because she was having some burning with urination.      PMH:    Past Medical History:  Diagnosis Date  . Anxiety   . Carotid artery disease (HCC)   . Glaucoma   . Hypertension   . Impaired fasting glucose   . Osteopenia   . Systolic murmur   . Tinnitus     PSH:    Past Surgical History:  Procedure Laterality Date  . CHOLECYSTECTOMY  2002  . GLAUCOMA SURGERY  02/2012  . TONSILLECTOMY  1948    ALLERGIES:   Review of patient's allergies indicates no known allergies.  Prior to Admit Meds:   (Not in a hospital admission) Family HX:    Family History  Problem Relation Age of Onset  . Heart disease Father   . Brain cancer Mother    Social HX:    Social History   Social History  . Marital status: Widowed    Spouse name: N/A  . Number of children: N/A  . Years of education: N/A   Occupational History  . Not on file.   Social History Main Topics  . Smoking status: Former  Smoker    Quit date: 03/22/1979  . Smokeless tobacco: Not on file  . Alcohol use No  . Drug use: Unknown  . Sexual activity: Not on file   Other Topics Concern  . Not on file   Social History Narrative  . No narrative on file     ROS:  All 11 ROS were addressed and are negative except what is stated in the HPI  PHYSICAL EXAM Vitals:   03/16/16 0518 03/16/16 0521  BP:    Pulse: 84 88  Resp: 18 18   General: Well developed, well nourished, in no acute distress Head: Eyes PERRLA, No xanthomas.   Normal cephalic and atramatic  Lungs:   Clear bilaterally to auscultation and percussion. Heart:   HRRR S1 S2 Pulses are 2+ & equal.            No carotid bruit. No JVD.  No abdominal bruits. No femoral bruits. Abdomen: Bowel sounds are positive, abdomen soft and non-tender without masses oMsk:  Back normal, normal gait. Normal strength and tone for age. Extremities:   No clubbing, cyanosis or edema.  DP +1 Neuro: Alert and oriented X 3. Psych:  Good affect, responds appropriately   Labs:  Lab Results  Component Value Date   WBC 16.4 (H) 03/16/2016   HGB 11.9 (L) 03/16/2016   HCT 35.0 (L) 03/16/2016   MCV 89.6 03/16/2016   PLT 328 03/16/2016    Recent Labs Lab 03/16/16 0507  NA 141  K 4.8  CL 105  BUN 32*  CREATININE 1.10*  GLUCOSE 246*   No results found for: CKTOTAL, CKMB, CKMBINDEX, TROPONINI No results found for: PTT No results found for: INR, PROTIME  No results found for: CHOL No results found for: HDL No results found for: LDLCALC No results found for: TRIG No results found for: CHOLHDL No results found for: LDLDIRECT    Radiology:  Dg Chest Port 1 View  Result Date: 03/16/2016 CLINICAL DATA:  82 year old female with shortness of breath and palpitation EXAM: PORTABLE CHEST 1 VIEW COMPARISON:  Chest radiograph dated 07/14/2015 FINDINGS: Mild diffuse interstitial prominence and nodularity may represent a degree of edema. Left lung base linear and streaky  densities may be related to crowding of the vasculature or atelectasis/ scarring. Infiltrate is less likely. There is no focal consolidation, pleural effusion, or pneumothorax. Stable cardiac silhouette. Atherosclerotic calcification of the aorta. No acute osseous pathology. IMPRESSION: Chronic changes with possible mild interstitial edema. Left lung base atelectatic changes. Infiltrate is less likely. Electronically Signed   By: Elgie Collard M.D.   On: 03/16/2016 05:34    EKGs:  SVT at 160bpm with LBBB, NSR with frequent PACs, NSR  ASSESSMENT/PLAN:   1.  Tachycardia of unclear etiology - when heart rate slows down she appears to be in NSR with PACs so ? Whether this is atrial tachycardia.  She is no in NSR at 80bpm after second amio bolus and amio gtt.  Labs are normal and Cxray LLL atelectasis and ? Interstitial edema.  Will admit to CCU and continue Amio gtt for now.  Will have EP see in the am.  Continue home does of BB(she has not missed any doses).  Check TSH.  Increase Atenolol to 25mg  daily.    2.  Moderate AS - asymptomatic  3.  Anxiety which her daughter says is a significant problem and ? Whether that was playing a role.  4. HTN - BP stable.  Continue diuretic and BB.  Hold ACE I for now to allow BP room to titrate BB.  5.  Dysuria - will send off UA and cx  6.  DNR  Armanda Magic, MD  03/16/2016  5:46 AM

## 2016-03-16 NOTE — ED Notes (Signed)
Dr. Mayford Knifeurner, card at bedside

## 2016-03-16 NOTE — ED Notes (Signed)
Dr.Oni speaking with Cardiology, Amiodarone bolus complete, pt speaking with PA student, A & O.  Pt HR fluctuating from 80's to 160

## 2016-03-16 NOTE — Progress Notes (Signed)
Patient Name: [name] Date of Encounter: 03/16/2016  Primary Cardiologist: Dr. Kristeen Miss Problem List     Active Problems:   SVT (supraventricular tachycardia) (HCC)     Subjective   I was called to see her by ICU nurse. She was having respiratory distress. Tachycardia once again returned into the 170-180 range. She was placed on BiPAP. Lasix 40 mg IV was administered. She is DO NOT RESUSCITATE. She denies any chest pain but she is short of breath.  Inpatient Medications    Scheduled Meds: . amiodarone      . aspirin  81 mg Oral Daily  . atenolol  25 mg Oral QHS  . brinzolamide  1 drop Both Eyes BID  . heparin  5,000 Units Subcutaneous Q8H  . hydrochlorothiazide  12.5 mg Oral Daily  . latanoprost  1 drop Both Eyes QHS  . LORazepam  0.25 mg Oral Daily  . loteprednol  1 drop Left Eye BID  . pantoprazole  40 mg Oral Daily  . pilocarpine  1 drop Both Eyes QID  . sertraline  75 mg Oral Daily  . sodium chloride flush  3 mL Intravenous Q12H  . sodium chloride flush  3 mL Intravenous Q12H  . timolol  1 drop Both Eyes Daily   Continuous Infusions: . amiodarone (NEXTERONE) IV bolus only 150 mg/100 mL Stopped (03/16/16 0516)  . amiodarone 60 mg/hr (03/16/16 0952)   Followed by  . amiodarone     PRN Meds: sodium chloride, amiodarone (NEXTERONE) IV bolus only 150 mg/100 mL, sodium bicarbonate, sodium chloride flush   Vital Signs    Vitals:   03/16/16 0800 03/16/16 0846 03/16/16 0900 03/16/16 0946  BP:   (!) 190/74 (!) 190/74  Pulse:   98 (!) 154  Resp:   (!) 21 (!) 35  Temp:  98.1 F (36.7 C)    TempSrc:  Oral    SpO2:   92% 94%  Weight: 137 lb 5.6 oz (62.3 kg)     Height: 5\' 2"  (1.575 m)      No intake or output data in the 24 hours ending 03/16/16 1003 Filed Weights   03/16/16 0446 03/16/16 0800  Weight: 135 lb (61.2 kg) 137 lb 5.6 oz (62.3 kg)    Physical Exam    GEN: Elderly, frail, sitting up in bed with BiPAP in respiratory distress    HEENT: Grossly normal.  Neck: Supple, no JVD, carotid bruits, or masses. Cardiac: Tachycardic regular, 2/6 systolic right upper sternal border murmur, no rubs, or gallops. No clubbing, cyanosis, edema.  Radials/DP/PT 2+ and equal bilaterally.  Respiratory:  Mildly labored respirations, increased respiratory rate, crackles at bases bilaterally. GI: Soft, nontender, nondistended, BS + x 4. MS: no deformity or atrophy. Skin: warm and dry, no rash. Neuro:  Strength and sensation are intact. Psych: AAOx3.  Normal affect.  Labs    CBC  Recent Labs  03/16/16 0433 03/16/16 0507 03/16/16 0905  WBC 16.4*  --  11.5*  NEUTROABS 12.9*  --  9.4*  HGB 11.6* 11.9* 11.9*  HCT 34.6* 35.0* 35.9*  MCV 89.6  --  90.2  PLT 328  --  280   Basic Metabolic Panel  Recent Labs  03/16/16 0433 03/16/16 0507  NA 139 141  K 4.9 4.8  CL 106 105  CO2 22  --   GLUCOSE 242* 246*  BUN 23* 32*  CREATININE 1.27* 1.10*  CALCIUM 7.9*  --   MG 1.5*  --  Cardiac Enzymes  Recent Labs  03/16/16 0457  TROPONINI 0.03*     Recent Labs  03/16/16 0527  TSH 2.285    Telemetry    Tachycardia 177 bpm with underlying left bundle branch block - Personally Reviewed  ECG     Sinus rhythm 88 with left bundle branch block- Personally Reviewed  Radiology    Dg Chest Port 1 View  Result Date: 03/16/2016 CLINICAL DATA:  82 year old female with shortness of breath and palpitation EXAM: PORTABLE CHEST 1 VIEW COMPARISON:  Chest radiograph dated 07/14/2015 FINDINGS: Mild diffuse interstitial prominence and nodularity may represent a degree of edema. Left lung base linear and streaky densities may be related to crowding of the vasculature or atelectasis/ scarring. Infiltrate is less likely. There is no focal consolidation, pleural effusion, or pneumothorax. Stable cardiac silhouette. Atherosclerotic calcification of the aorta. No acute osseous pathology. IMPRESSION: Chronic changes with possible mild  interstitial edema. Left lung base atelectatic changes. Infiltrate is less likely. Electronically Signed   By: Elgie Collard M.D.   On: 03/16/2016 05:34    Cardiac Studies   ECHO: 03/16/16 - Left ventricle: The cavity size was normal. Wall thickness was normal. Systolic function was normal. The estimated ejection fraction was in the range of 55% to 60%. Wall motion was normal;  there were no regional wall motion abnormalities. Doppler  parameters are consistent with abnormal left ventricular relaxation (grade 1 diastolic dysfunction). Doppler parameters are consistent with high ventricular filling pressure. - Aortic valve: Cusp separation was reduced. Valve mobility was  restricted. There was moderate stenosis. There was trivial  regurgitation. Valve area (VTI): 1.06 cm^2. Valve area (Vmax):  1.14 cm^2. Valve area (Vmean): 1.08 cm^2. - Left atrium: The atrium was mildly dilated.  Patient Profile     82 year old female with tachycardia, underlying left bundle branch block, normal ejection fraction, moderate aortic stenosis  Assessment & Plan    Wide-complex tachycardia  - Dr. Mayford Knife placed on amiodarone. This was discontinued after her heart rate decreased into the 60s with sinus rhythm. She once again returned to heart rate of 170-180. Wide-complex tachycardia, left bundle branch block morphology as compared to baseline.  - Given this rhythm, she developed respiratory distress. BiPAP was placed. She is DO NOT RESUSCITATE. She does not wish to be intubated.  - I also gave her Lasix 40 mg IV 1.  - Restarted amiodarone drip with bolus.  - We will be careful given her underlying left bundle branch block and advanced age for worsening conduction.  - I discussed with Dr. Loman Brooklyn of electrophysiology who will be seeing her later today.  Acute respiratory failure  - BiPAP  - Lasix IV 40 mg  - Reduce heart rate with amiodarone  - This is likely being driven by her tachycardia.  - Mild  interstitial edema noted on chest x-ray.  Moderate aortic stenosis  - Should not be of any significant clinical concern at this point. Normal ejection fraction.  She currently lives at [facility]  She has had prior tachycardia with left bundle branch block.  Given her advanced age and prior review of Dr. Elissa Hefty note from 08/10/15-no further ischemic evaluation. We'll continue with aggressive medical management.  Essential hypertension  - This is been a long-standing issue. We will continue to monitor closely.  Elevated troponin  - Mild elevation 0.03 likely demand ischemia in the setting of tachycardia.  If condition does not improve, consider palliative care consultation.  Critical care time 35 minutes  spent with patient, discussion with nursing, discussion with care team including Dr. Elberta Fortisamnitz, review of medical records and data extensive. Currently in respiratory distress, critically ill.  Signed, Kari SchultzMark Marcell Chavarin, MD  03/16/2016, 10:03 AM

## 2016-03-17 ENCOUNTER — Encounter (HOSPITAL_COMMUNITY): Payer: Self-pay

## 2016-03-17 MED ORDER — LORAZEPAM 2 MG/ML IJ SOLN
0.5000 mg | INTRAMUSCULAR | Status: DC | PRN
  Administered 2016-03-17 – 2016-03-18 (×3): 1 mg via INTRAVENOUS
  Filled 2016-03-17 (×3): qty 1

## 2016-03-17 MED ORDER — AMIODARONE HCL 200 MG PO TABS
200.0000 mg | ORAL_TABLET | Freq: Two times a day (BID) | ORAL | Status: DC
  Filled 2016-03-17 (×2): qty 1

## 2016-03-17 MED ORDER — MORPHINE SULFATE (PF) 2 MG/ML IV SOLN
2.0000 mg | INTRAVENOUS | Status: DC | PRN
  Administered 2016-03-16 – 2016-03-18 (×3): 2 mg via INTRAVENOUS
  Filled 2016-03-17 (×3): qty 1

## 2016-03-17 MED ORDER — LORAZEPAM 2 MG/ML IJ SOLN: 0.5000 mg | INTRAMUSCULAR | Status: DC | PRN

## 2016-03-17 MED ORDER — HALOPERIDOL LACTATE 5 MG/ML IJ SOLN: 2.0000 mg | Freq: Four times a day (QID) | INTRAMUSCULAR | Status: DC | PRN

## 2016-03-17 NOTE — Progress Notes (Signed)
Pt was found in bed attempting to remove foley catheter. Pt reoriented and educated on the indwelling catheter, the purpose and how damaging it can be to purposely pull it out. Light tinged blood noted in catheter tubing. Most likely to due to patient pulling at tubing. Safety mittens placed on patient. Pt became very agitated and work up to the point where O2 sats began to drop in the low 80's. Mittens removed at this time. Pt was repositioned, reoriented and reassured. Pt now out of respiratory distress. Will continue to monitor and assess.

## 2016-03-17 NOTE — Consult Note (Signed)
[facility] [employer]   Received request from [place] for family interest in [facility]. Chart reviewed and met with patient's daughter at bedside to confirm interest and explain services. Daughter and CSW Denyse Amass aware no [facility] availability today. Plan to update CSW and family tomorrow re availability.   Thank you,  Erling Conte, LCSW [phone]

## 2016-03-17 NOTE — Progress Notes (Signed)
Palliative Medicine RN Note: Follow up meeting with PMT. Pt is in bed with O2 on continuously. She is only able to speak 3-4 words without shortness of breath and is completely intolerant of activity. Pt has 24 hour sitter; notes indicate she was attempting to remove lines (foley) overnight and required mittens. Her mental status has been fluctating wildly.  Cardiology saw pt while I was meeting with daughter. Pt is on amio and lasix for palliation.   Daughter describes a rapid, steep functional decline; pt was able to walk with a walker and play cards on Wednesday and is now unable to do any activity. She requires multiple medications for comfort and symptom management; please see MAR. Pt does not have 24 hour CG and lives in an independent living facility; family is physically unable to provide care. Her medication regimen requires close monitoring by physician and RN due to rapid changes in status and symptoms. Dr Phillips OdorGolding ordered referral for placement at inpt hospice d/t delirium, symptoms and rapid decline with multiple medical interventions.  Discussed choice with daughter ([facility], [facility], [place], [facility], [facility], [facility]). Daughter Toniann FailWendy states she wants pt [place] to [place], so [facility] is first choice. Updated SW Brayton CavesJessie; PMT RN notified Carley HammedEva w [facility] of referral.  Margret ChanceMelanie G. Amore Grater, RN, BSN, [facility] 03/17/2016 11:49 AM Cell [phone] 8:00-4:00 Monday-Friday Office [phone]

## 2016-03-17 NOTE — Progress Notes (Signed)
Patient Name: [name] Date of Encounter: 03/17/2016  Primary Cardiologist: Dr. Kristeen Miss Problem List     Active Problems:   SVT (supraventricular tachycardia) (HCC)   Acute respiratory failure with hypoxia (HCC)   Left bundle branch block   Atypical atrial flutter (HCC)     Subjective   Nervous. No significant SOB, no CP. "Where is my daughter". Tried to crawl out of bed.  Inpatient Medications    Scheduled Meds: . amiodarone  200 mg Oral BID  . aspirin  81 mg Oral Daily  . atenolol  25 mg Oral QHS  . brinzolamide  1 drop Both Eyes BID  . enoxaparin (LOVENOX) injection  30 mg Subcutaneous Q24H  . hydrochlorothiazide  12.5 mg Oral Daily  . latanoprost  1 drop Both Eyes QHS  . LORazepam  0.25 mg Oral Daily  . loteprednol  1 drop Left Eye BID  . pantoprazole  40 mg Oral Daily  . pilocarpine  1 drop Both Eyes QID  . sertraline  75 mg Oral Daily  . sodium chloride flush  3 mL Intravenous Q12H  . sodium chloride flush  3 mL Intravenous Q12H  . timolol  1 drop Both Eyes Daily   Continuous Infusions: . amiodarone (NEXTERONE) IV bolus only 150 mg/100 mL Stopped (03/16/16 0516)   PRN Meds: sodium chloride, amiodarone (NEXTERONE) IV bolus only 150 mg/100 mL, LORazepam, morphine injection, sodium bicarbonate, sodium chloride flush   Vital Signs    Vitals:   03/16/16 2110 03/17/16 0040 03/17/16 0451 03/17/16 0839  BP: 133/67 (!) 150/70 (!) 151/73 (!) 169/68  Pulse: 72 64  63  Resp: 18 (!) 22 20 (!) 25  Temp: 98.3 F (36.8 C) 98.7 F (37.1 C) 99.2 F (37.3 C) 97.8 F (36.6 C)  TempSrc: Oral Oral Oral Axillary  SpO2: 95% 94% 95% 98%  Weight:   133 lb 6.4 oz (60.5 kg)   Height:        Intake/Output Summary (Last 24 hours) at 03/17/16 1108 Last data filed at 03/17/16 0850  Gross per 24 hour  Intake           661.09 ml  Output             3525 ml  Net         -2863.91 ml   Filed Weights   03/16/16 0446 03/16/16 0800 03/17/16 0451  Weight: 135  lb (61.2 kg) 137 lb 5.6 oz (62.3 kg) 133 lb 6.4 oz (60.5 kg)    Physical Exam    GEN: elderly frail, in no acute distress.  HEENT: Grossly normal.  Neck: Supple, no JVD, carotid bruits, or masses. Cardiac: RRR, 2/6 S murmur, no rubs, or gallops. No clubbing, cyanosis, edema.  Radials/DP/PT 2+ and equal bilaterally.  Respiratory:  Respirations regular and unlabored, clear to auscultation bilaterally with only mild basilar crackles. GI: Soft, nontender, nondistended, BS + x 4. MS: no deformity or atrophy. Skin: warm and dry, no rash. Neuro:  Strength and sensation are intact. Psych: anxious.  Labs    CBC  Recent Labs  03/16/16 0433 03/16/16 0507 03/16/16 0905  WBC 16.4*  --  11.5*  NEUTROABS 12.9*  --  9.4*  HGB 11.6* 11.9* 11.9*  HCT 34.6* 35.0* 35.9*  MCV 89.6  --  90.2  PLT 328  --  280   Basic Metabolic Panel  Recent Labs  03/16/16 0433 03/16/16 0507 03/16/16 0905  NA 139 141 140  K 4.9  4.8 3.7  CL 106 105 108  CO2 22  --  22  GLUCOSE 242* 246* 146*  BUN 23* 32* 21*  CREATININE 1.27* 1.10* 1.14*  CALCIUM 7.9*  --  9.4  MG 1.5*  --  1.5*  PHOS  --   --  4.0   Liver Function Tests  Recent Labs  03/16/16 0905  AST 134*  ALT 106*  ALKPHOS 213*  BILITOT 0.5  PROT 6.8  ALBUMIN 3.4*   No results for input(s): LIPASE, AMYLASE in the last 72 hours. Cardiac Enzymes  Recent Labs  03/16/16 0457  TROPONINI 0.03*   Thyroid Function Tests  Recent Labs  03/16/16 0905  TSH 1.568    Telemetry    NSR now.  - Personally Reviewed  ECG    Prior SVT with LBBB - Personally Reviewed  Radiology    Dg Chest Port 1 View  Result Date: 03/16/2016 CLINICAL DATA:  82 year old female with shortness of breath and palpitation EXAM: PORTABLE CHEST 1 VIEW COMPARISON:  Chest radiograph dated 07/14/2015 FINDINGS: Mild diffuse interstitial prominence and nodularity may represent a degree of edema. Left lung base linear and streaky densities may be related to  crowding of the vasculature or atelectasis/ scarring. Infiltrate is less likely. There is no focal consolidation, pleural effusion, or pneumothorax. Stable cardiac silhouette. Atherosclerotic calcification of the aorta. No acute osseous pathology. IMPRESSION: Chronic changes with possible mild interstitial edema. Left lung base atelectatic changes. Infiltrate is less likely. Electronically Signed   By: Elgie Collard M.D.   On: 03/16/2016 05:34    Cardiac Studies   ECHO: 03/16/16 - Left ventricle: The cavity size was normal. Wall thickness wasnormal. Systolic function was normal. The estimated ejectionfraction was in the range of 55% to 60%. Wall motion was normal;there were no regional wall motion abnormalities. Dopplerparameters are consistent with abnormal left ventricularrelaxation (grade 1 diastolic dysfunction). Doppler parametersare consistent with high ventricular filling pressure. - Aortic valve: Cusp separation was reduced. Valve mobility wasrestricted. There was moderate stenosis. There was trivialregurgitation. Valve area (VTI): 1.06 cm^2. Valve area (Vmax):1.14 cm^2. Valve area (Vmean): 1.08 cm^2. - Left atrium: The atrium was mildly dilated.  Patient Profile     82 year old female with tachycardia, underlying left bundle branch block, normal ejection fraction, moderate aortic stenosis  Assessment & Plan    Wide-complex tachycardia  - Amiodarone changing from IV to PO 200 BID.    - We will be careful given her underlying left bundle branch block and advanced age for worsening conduction. AMIO is for palliation.   - I discussed with Dr. Johney Frame   Acute respiratory failure  - did not tolerate BiPAP  - Lasix IV 40 mg improved  - Lasix PO 40mg  PRN for comfort  - Reduced heart rate with amiodarone which ultimately reduced distress  - This is likely being driven by her tachycardia.  - Mild interstitial edema noted on chest x-ray.  Moderate aortic stenosis  -  Should not be of any significant clinical concern at this point. Normal ejection fraction.  She currently lives at [facility]  She has had prior tachycardia with left bundle branch block.  Given her advanced age and prior review of Dr. Elissa Hefty note from 08/10/15-no further ischemic evaluation. We'll continue with aggressive medical management.  Essential hypertension  - This is been a long-standing issue. We will continue to monitor closely.  Elevated troponin  - Mild elevation 0.03 likely demand ischemia in the setting of tachycardia.  Palliative care  -  appreciate palliative team  - OK with having a hospice referral upon dischage.   - OK with DC today to [facility] with hospice care.    - Roxanol 10mg  SL q2 PRN for dyspnea, anxiety and distress Dispense: 30ml ON DISCHARGE- ok to continue IV morphine PRN as ordered while inpatient Hospice referral   Signed, Donato Schultz, MD  03/17/2016, 11:08 AM

## 2016-03-17 NOTE — Clinical Social Work Note (Signed)
Clinical Social Work Assessment  Patient Details  Name: [name] MRN: [id number] Date of Birth: December 24, 1920  Date of referral:  03/17/16               Reason for consult:  Facility Placement, End of Life/Hospice                Permission sought to share information with:  Family Supports Permission granted to share information::  Yes, Verbal Permission Granted  Name::     Laural Golden  Relationship::  Daughter  Contact Information:  [phone]  Housing/Transportation Living arrangements for the past 2 months:  [place] ([facility]) Source of Information:  Patient, Adult Children Patient Interpreter Needed:  None Criminal Activity/Legal Involvement Pertinent to Current Situation/Hospitalization:  No - Comment as needed Significant Relationships:  Adult Children, Other Family Members Lives with:  Facility Resident Do you feel safe going back to the place where you live?  Yes Need for family participation in patient care:  Yes (Comment)  Care giving concerns:  Patient daughter concerned about patient being discharged too soon back to [facility], since she is so far from baseline.   Social Worker assessment / plan:  Holiday representative met with patient daughter at bedside to offer support and discuss patient needs at discharge.  Patient daughter states that patient is from [place] and has a CNA to assist for one hour a day in the mornings.  Patient spouse died 71 years ago and patient has lived at [facility] ever since.  Patient daughter is aware that patient cannot return in her current state and is agreeable to SNF placement.  Patient daughter verbalizes preference to [facility].  CSW was going to initiate search and follow up.  Upon leaving the room, CSW spoke with bedside RN about patient current status.  Patient has been evaluated by Palliative Care services, however CSW feels that there is a significant change and patient verbalizes that she feels  like she is "dying."  Palliative medicine came back to assess patient speak with patient daughter, who now believe that patient is residential hospice eligible.  Patient daughter has preference to [facility].  Referral placed with hopes of a possible bed 10/21.  Patient is actively declining and patient daughter is prepared if patient does not sustain through the hospital stay.  CSW remains available to patient family and staff for support and guidance.  Employment status:  Retired Forensic scientist:  Medicare PT Recommendations:  Not assessed at this time [place] / Referral to community resources:  Other (Comment Required) (Residential Hospice)  Patient/Family's Response to care:  Patient daughter verbalizes understanding of CSW role and appreciation for support and involvement.  Patient daughter is appreciative of guidance and hopeful for residential hospice placement if patient survives hospital stay.  Patient/Family's Understanding of and Emotional Response to Diagnosis, Current Treatment, and Prognosis:  Patient daughter is completely aware and realistic about patient current state and potential prognosis.  Patient family is coming from out of town to be here for the weekend.  Patient daughter appropriately emotional regarding the quick transition of patient health.  Emotional Assessment Appearance:  Appears stated age Attitude/Demeanor/Rapport:  Unable to Assess Affect (typically observed):  Unable to Assess Orientation:  Oriented to Self Alcohol / Substance use:  Not Applicable Psych involvement (Current and /or in the community):  No (Comment)  Discharge Needs  Concerns to be addressed:  Coping/Stress Concerns, Care Coordination Readmission within the last 30 days:  No Current discharge risk:  Chronically ill Barriers to Discharge:  Continued Medical Work up, Hospice Bed not available  [employer]

## 2016-03-17 NOTE — Progress Notes (Signed)
Pt has been resting since anti-anxiety medication given earlier. Pt sats have been 96%, on 5L. Pt is comfortable. Family is at bedside. Routine Assessments have been completed, will do VS QS or as pt allows. Comfort cart ordered for pt. RN will continue to monitor. Family has been updated.

## 2016-03-17 NOTE — Progress Notes (Signed)
  Reviewed with [place], patient's nurse for today that [facility] There are no beds available today. Likely discharge tomorrow. Will make weekend team aware.  Signed, Ellsworth LennoxBrittany M Strader, PA-C 03/17/2016, 4:08 PM Pager: [phone]

## 2016-03-17 NOTE — Progress Notes (Signed)
Palliative Medicine RN Note: Rec'd call from RN; pt refusing po ativan d/t trouble swallowing. Obtained order for IV ativan from Dr Phillips OdorGolding. Expect pt will need continued IV or SQ medication in hospice if she continues to refuse po/SL d/t choking/dysphagia.  Margret ChanceMelanie G. Kaisley Stiverson, RN, BSN, [facility] 03/17/2016 12:40 PM Cell [phone] 8:00-4:00 Monday-Friday Office [phone]

## 2016-03-17 NOTE — Progress Notes (Signed)
Pt experiencing increased SOB. RN administered PRN morphine. Pt also becoming restless. Ativan ordered. Will administer

## 2016-03-18 DIAGNOSIS — I471 Supraventricular tachycardia: Principal | ICD-10-CM

## 2016-03-18 LAB — URINE CULTURE: Culture: >=100000 — AB

## 2016-03-18 MED ORDER — AMIODARONE HCL 200 MG PO TABS: 200.0000 mg | ORAL_TABLET | Freq: Every day | ORAL | Status: DC

## 2016-03-18 MED ORDER — FUROSEMIDE 40 MG PO TABS: 40.0000 mg | ORAL_TABLET | Freq: Every day | ORAL | 1 refills | Status: AC | PRN

## 2016-03-18 MED ORDER — AMIODARONE HCL 200 MG PO TABS: 200.0000 mg | ORAL_TABLET | Freq: Every day | ORAL | 1 refills | Status: AC

## 2016-03-18 NOTE — Progress Notes (Signed)
Upon morning assessment patient resting well, when asking her about taking her meds, eating, and eye drops patient stated "NO". Will continue to monitor patient.

## 2016-03-18 NOTE — Progress Notes (Signed)
Patient discharging to [facility], IV taken out, monitor removed patient bathed and transported via [facility]. Family updated

## 2016-03-18 NOTE — Progress Notes (Signed)
CM received call from RN requesting hospice referral to [facility].  CM met with family who confirms with unit RN and CSW note, pt going to residential hospice.  No other CM needs were communicated.

## 2016-03-18 NOTE — Progress Notes (Signed)
Patient Name: [name] Date of Encounter: 03/18/2016  Primary Cardiologist: Dr. Kristeen MissHarding  Hospital Problem List     Active Problems:   SVT (supraventricular tachycardia) (HCC)   Acute respiratory failure with hypoxia (HCC)   Left bundle branch block   Atypical atrial flutter [facility](HCC)     Subjective  82 yo patient of [name] Admitted 10/19 with palpitations Hx of HTN  ECG showed WCT.  Was cardioverted in the ER without success  Treated with IV amio    Inpatient Medications    Scheduled Meds: . amiodarone  200 mg Oral BID  . aspirin  81 mg Oral Daily  . atenolol  25 mg Oral QHS  . brinzolamide  1 drop Both Eyes BID  . enoxaparin (LOVENOX) injection  30 mg Subcutaneous Q24H  . hydrochlorothiazide  12.5 mg Oral Daily  . latanoprost  1 drop Both Eyes QHS  . loteprednol  1 drop Left Eye BID  . pantoprazole  40 mg Oral Daily  . pilocarpine  1 drop Both Eyes QID  . sertraline  75 mg Oral Daily  . sodium chloride flush  3 mL Intravenous Q12H  . sodium chloride flush  3 mL Intravenous Q12H  . timolol  1 drop Both Eyes Daily   Continuous Infusions: . amiodarone (NEXTERONE) IV bolus only 150 mg/100 mL Stopped (03/16/16 0516)   PRN Meds: sodium chloride, amiodarone (NEXTERONE) IV bolus only 150 mg/100 mL, haloperidol lactate, LORazepam, morphine injection, sodium bicarbonate, sodium chloride flush   Vital Signs    Vitals:   03/17/16 1400 03/17/16 1700 03/17/16 2017 03/18/16 0600  BP:   (!) 158/80 (!) 170/72  Pulse: (!) 53 61 (!) 59 64  Resp: (!) 21 (!) 23 (!) 25 (!) 21  Temp:   98.8 F (37.1 C) 98.3 F (36.8 C)  TempSrc:   Oral Oral  SpO2: 96% 99% 95% 100%  Weight:      Height:        Intake/Output Summary (Last 24 hours) at 03/18/16 0826 Last data filed at 03/17/16 1100  Gross per 24 hour  Intake              240 ml  Output                0 ml  Net              240 ml   Filed Weights   03/16/16 0446 03/16/16 0800 03/17/16 0451  Weight: 135 lb  (61.2 kg) 137 lb 5.6 oz (62.3 kg) 133 lb 6.4 oz (60.5 kg)    Physical Exam    GEN: elderly frail, in no acute distress.  HEENT: Grossly normal.  Neck: Supple, no JVD, carotid bruits, or masses. Cardiac: RRR, 2/6 S murmur, no rubs, or gallops. No clubbing, cyanosis, edema.  Radials/DP/PT 2+ and equal bilaterally.  Respiratory:  Respirations regular and unlabored, clear to auscultation bilaterally with only mild basilar crackles. GI: Soft, nontender, nondistended, BS + x 4. MS: no deformity or atrophy. Skin: warm and dry, no rash. Neuro:  Weak but no focal deficits  Psych: anxious.  Labs    CBC  Recent Labs  03/16/16 0433 03/16/16 0507 03/16/16 0905  WBC 16.4*  --  11.5*  NEUTROABS 12.9*  --  9.4*  HGB 11.6* 11.9* 11.9*  HCT 34.6* 35.0* 35.9*  MCV 89.6  --  90.2  PLT 328  --  280   Basic Metabolic Panel  Recent Labs  03/16/16 0433  03/16/16 0507 03/16/16 0905  NA 139 141 140  K 4.9 4.8 3.7  CL 106 105 108  CO2 22  --  22  GLUCOSE 242* 246* 146*  BUN 23* 32* 21*  CREATININE 1.27* 1.10* 1.14*  CALCIUM 7.9*  --  9.4  MG 1.5*  --  1.5*  PHOS  --   --  4.0   Liver Function Tests  Recent Labs  03/16/16 0905  AST 134*  ALT 106*  ALKPHOS 213*  BILITOT 0.5  PROT 6.8  ALBUMIN 3.4*   No results for input(s): LIPASE, AMYLASE in the last 72 hours. Cardiac Enzymes  Recent Labs  03/16/16 0457  TROPONINI 0.03*   Thyroid Function Tests  Recent Labs  03/16/16 0905  TSH 1.568    Telemetry    NSR now.  - Personally Reviewed  ECG    Prior SVT with LBBB - Personally Reviewed  Radiology    No results found.  Cardiac Studies   ECHO: 03/16/16 - Left ventricle: The cavity size was normal. Wall thickness wasnormal. Systolic function was normal. The estimated ejectionfraction was in the range of 55% to 60%. Wall motion was normal;there were no regional wall motion abnormalities. Dopplerparameters are consistent with abnormal left  ventricularrelaxation (grade 1 diastolic dysfunction). Doppler parametersare consistent with high ventricular filling pressure. - Aortic valve: Cusp separation was reduced. Valve mobility wasrestricted. There was moderate stenosis. There was trivialregurgitation. Valve area (VTI): 1.06 cm^2. Valve area (Vmax):1.14 cm^2. Valve area (Vmean): 1.08 cm^2. - Left atrium: The atrium was mildly dilated.  Patient Profile     82 year old female with tachycardia, underlying left bundle branch block, normal ejection fraction, moderate aortic stenosis  Assessment & Plan    Wide-complex tachycardia On amio   will decrease dose to 200 mg a day   Acute respiratory failure  - did not tolerate BiPAP  - Lasix IV 40 mg improved  - Lasix PO 40mg  PRN for comfort   Moderate aortic stenosis  - Should not be of any significant clinical concern at this point. Normal ejection fraction.  She currently lives at [facility] Will be going to [facility]  Essential hypertension  - This is been a long-standing issue. We will continue to monitor closely.  Elevated troponin  - Mild elevation 0.03 likely demand ischemia in the setting of tachycardia.  Palliative care  - appreciate palliative team  - OK with having a hospice referral upon dischage.   - OK with DC today to [facility] with hospice care.    - Roxanol 10mg  SL q2 PRN for dyspnea, anxiety and distress Dispense: 30ml ON DISCHARGE- ok to continue IV morphine PRN as ordered while inpatient Hospice referral   Signed, Kristeen Miss, MD  03/18/2016, 8:26 AM

## 2016-03-18 NOTE — Discharge Summary (Signed)
Discharge Summary    Patient ID: Kari MinceMarilyn Fero,  MRN: [id number], DOB/AGE: 1920/09/30 82 y.o.  Admit date: 03/16/2016 Discharge date: 03/18/2016  Primary Care Provider: Lillia MountainGRIFFIN,JOHN JOSEPH Primary Cardiologist: Dr. [name]   Discharge Diagnoses    Principal Problem:   SVT (supraventricular tachycardia) (HCC) Active Problems:   Essential hypertension   Moderate aortic stenosis by prior echocardiogram   Acute respiratory failure with hypoxia (HCC)   Left bundle branch block   Atypical atrial flutter (HCC)   Allergies No Known Allergies   History of Present Illness     [name] is a 82 y.o. female [facility] nursing home resident with a history of moderate AS, RBBB, mild vascular dementia, macular degeneration, hearing loss and intermittent tachycardias who was admitted on 03/16/2016 for palpitations and found to have a wide complex tachycardia.   She is DNR/DNI.  She developed palpitations the day of admission associated with shortness of breath and presented to the ER for further evaluation.   Hospital Course     Consultants: palliative care and hospice  She was admitted with HR 180's -A flutter with underlying RBBB. She was placed on IV amiodarone with restoration of SR after unsuccessful cardioversion. She developed subsequent pulmonary edema requiring BIPAP. In the ICU, she refused bipap and was upset that she "was resuscitated" and reported that she just wanted to go home. She was placed on IV lasix with improvement. Plan will be for lasix 40mg  PRN for comfort. Amio was decreased to 200mg  daily today. This is being used to keep her comfortable and out of tachyrhythmias. There was mild elevation 0.03 likely demand ischemia in the setting of tachycardia. Palliative care team was consulted to placed her on Roxanol 10mg  SL q2 PRN for dyspnea, anxiety and distress. [facility].   The patient has had an  uncomplicated hospital course and will be discharged to hospice. She has been seen by Dr. Elease HashimotoNahser today and deemed [facility]. No follow up appointment neccessary. Discharge medications are listed below.  _____________  Discharge Vitals Blood pressure (!) 170/72, pulse 64, temperature 98.3 F (36.8 C), temperature source Oral, resp. rate (!) 21, height 5\' 2"  (1.575 m), weight 133 lb 6.4 oz (60.5 kg), SpO2 100 %.  Filed Weights   03/16/16 0446 03/16/16 0800 03/17/16 0451  Weight: 135 lb (61.2 kg) 137 lb 5.6 oz (62.3 kg) 133 lb 6.4 oz (60.5 kg)    Labs & Radiologic Studies     CBC  Recent Labs  03/16/16 0433 03/16/16 0507 03/16/16 0905  WBC 16.4*  --  11.5*  NEUTROABS 12.9*  --  9.4*  HGB 11.6* 11.9* 11.9*  HCT 34.6* 35.0* 35.9*  MCV 89.6  --  90.2  PLT 328  --  280   Basic Metabolic Panel  Recent Labs  03/16/16 0433 03/16/16 0507 03/16/16 0905  NA 139 141 140  K 4.9 4.8 3.7  CL 106 105 108  CO2 22  --  22  GLUCOSE 242* 246* 146*  BUN 23* 32* 21*  CREATININE 1.27* 1.10* 1.14*  CALCIUM 7.9*  --  9.4  MG 1.5*  --  1.5*  PHOS  --   --  4.0   Liver Function Tests  Recent Labs  03/16/16 0905  AST 134*  ALT 106*  ALKPHOS 213*  BILITOT 0.5  PROT 6.8  ALBUMIN 3.4*   No results for input(s): LIPASE, AMYLASE in the  last 72 hours. Cardiac Enzymes  Recent Labs  03/16/16 0457  TROPONINI 0.03*   Thyroid Function Tests  Recent Labs  03/16/16 0905  TSH 1.568    Dg Chest Port 1 View  Result Date: 03/16/2016 CLINICAL DATA:  82 year old female with shortness of breath and palpitation EXAM: PORTABLE CHEST 1 VIEW COMPARISON:  Chest radiograph dated 07/14/2015 FINDINGS: Mild diffuse interstitial prominence and nodularity may represent a degree of edema. Left lung base linear and streaky densities may be related to crowding of the vasculature or atelectasis/ scarring. Infiltrate is less likely. There is no focal consolidation, pleural  effusion, or pneumothorax. Stable cardiac silhouette. Atherosclerotic calcification of the aorta. No acute osseous pathology. IMPRESSION: Chronic changes with possible mild interstitial edema. Left lung base atelectatic changes. Infiltrate is less likely. Electronically Signed   By: Elgie Collard M.D.   On: 03/16/2016 05:34     Diagnostic Studies/Procedures    ECHO: 03/16/16 - Left ventricle: The cavity size was normal. Wall thickness wasnormal. Systolic function was normal. The estimated ejectionfraction was in the range of 55% to 60%. Wall motion was normal;there were no regional wall motion abnormalities. Dopplerparameters are consistent with abnormal left ventricularrelaxation (grade 1 diastolic dysfunction). Doppler parametersare consistent with high ventricular filling pressure. - Aortic valve: Cusp separation was reduced. Valve mobility wasrestricted. There was moderate stenosis. There was trivialregurgitation. Valve area (VTI): 1.06 cm^2. Valve area (Vmax):1.14 cm^2. Valve area (Vmean): 1.08 cm^2. - Left atrium: The atrium was mildly dilated. _____________    Disposition   Pt is being discharged home today in good condition.  Follow-up Plans & Appointments   NONE    Discharge Medications     Medication List    TAKE these medications   amiodarone 200 MG tablet Commonly known as:  PACERONE Take 1 tablet (200 mg total) by mouth daily. Start taking on:  03/19/2016   aspirin 81 MG chewable tablet Chew 81 mg by mouth daily.   atenolol 25 MG tablet Commonly known as:  TENORMIN Take 1 tablet (25 mg total) by mouth at bedtime. What changed:  how much to take   bimatoprost 0.03 % ophthalmic solution Commonly known as:  LUMIGAN Place 1 drop into both eyes at bedtime.   brinzolamide 1 % ophthalmic suspension Commonly known as:  AZOPT Place 1 drop into both eyes 2 (two) times daily.   CALCIUM 600 + D 600-200 MG-UNIT Tabs Generic drug:  Calcium  Carb-Cholecalciferol Take 1 tablet by mouth 2 (two) times daily.   DUREZOL 0.05 % Emul Generic drug:  Difluprednate Place 1 drop into the left eye 2 (two) times daily.   furosemide 40 MG tablet Commonly known as:  LASIX Take 1 tablet (40 mg total) by mouth daily as needed for fluid (SOB).   hydrochlorothiazide 25 MG tablet Commonly known as:  HYDRODIURIL Take 12.5 mg by mouth daily.   LORazepam 0.5 MG tablet Commonly known as:  ATIVAN Take 0.25 mg by mouth daily.   multivitamin capsule Take 1 capsule by mouth daily.   multivitamin with minerals Tabs tablet Take 1 tablet by mouth daily.   pantoprazole 40 MG tablet Commonly known as:  PROTONIX Take 1 tablet by mouth daily.   pilocarpine 4 % ophthalmic solution Commonly known as:  PILOCAR Place 1 drop into both eyes 4 (four) times daily.   ramipril 10 MG capsule Commonly known as:  ALTACE Take 10 mg by mouth daily.   sertraline 50 MG tablet Commonly known as:  ZOLOFT Take 75  mg by mouth daily.   SYSTANE 0.4-0.3 % Soln Generic drug:  Polyethyl Glycol-Propyl Glycol Apply 1 drop to eye 4 (four) times daily as needed (for dry eyes).   timolol 0.5 % ophthalmic solution Commonly known as:  BETIMOL Place 1 drop into both eyes daily.         Outstanding Labs/Studies   NONE  Duration of Discharge Encounter   Greater than 30 minutes including physician time.  Signed, Cline Crock PA-C 03/18/2016, 8:57 AM   Attending Note:   The patient was seen and examined.  Agree with assessment and plan as noted above.  Changes made to the above note as needed.  Patient seen and independently examined with Carlean Jews, PA .   We discussed all aspects of the encounter. I agree with the assessment and plan as stated above.  Pt has a room at [facility]. See progress note from day of discharge.    I have spent a total of 40 minutes with patient reviewing hospital  notes , telemetry, EKGs, labs and examining  patient as well as establishing an assessment and plan that was discussed with the patient. > 50% of time was spent in direct patient care.    Vesta Mixer, Montez Hageman., MD, [facility] 03/20/2016, 12:11 PM [address]  [address] Office [phone] Pager [phone]

## 2016-03-29 DEATH — deceased

## 2017-05-12 IMAGING — CR DG CHEST 1V PORT
1 series · 1 of 1 positions shown · non-contrast
Comparison: Chest radiograph dated 07/14/2015

CLINICAL DATA: [AGE] female with shortness of breath and
palpitation

EXAM:
PORTABLE CHEST 1 VIEW

[AP]
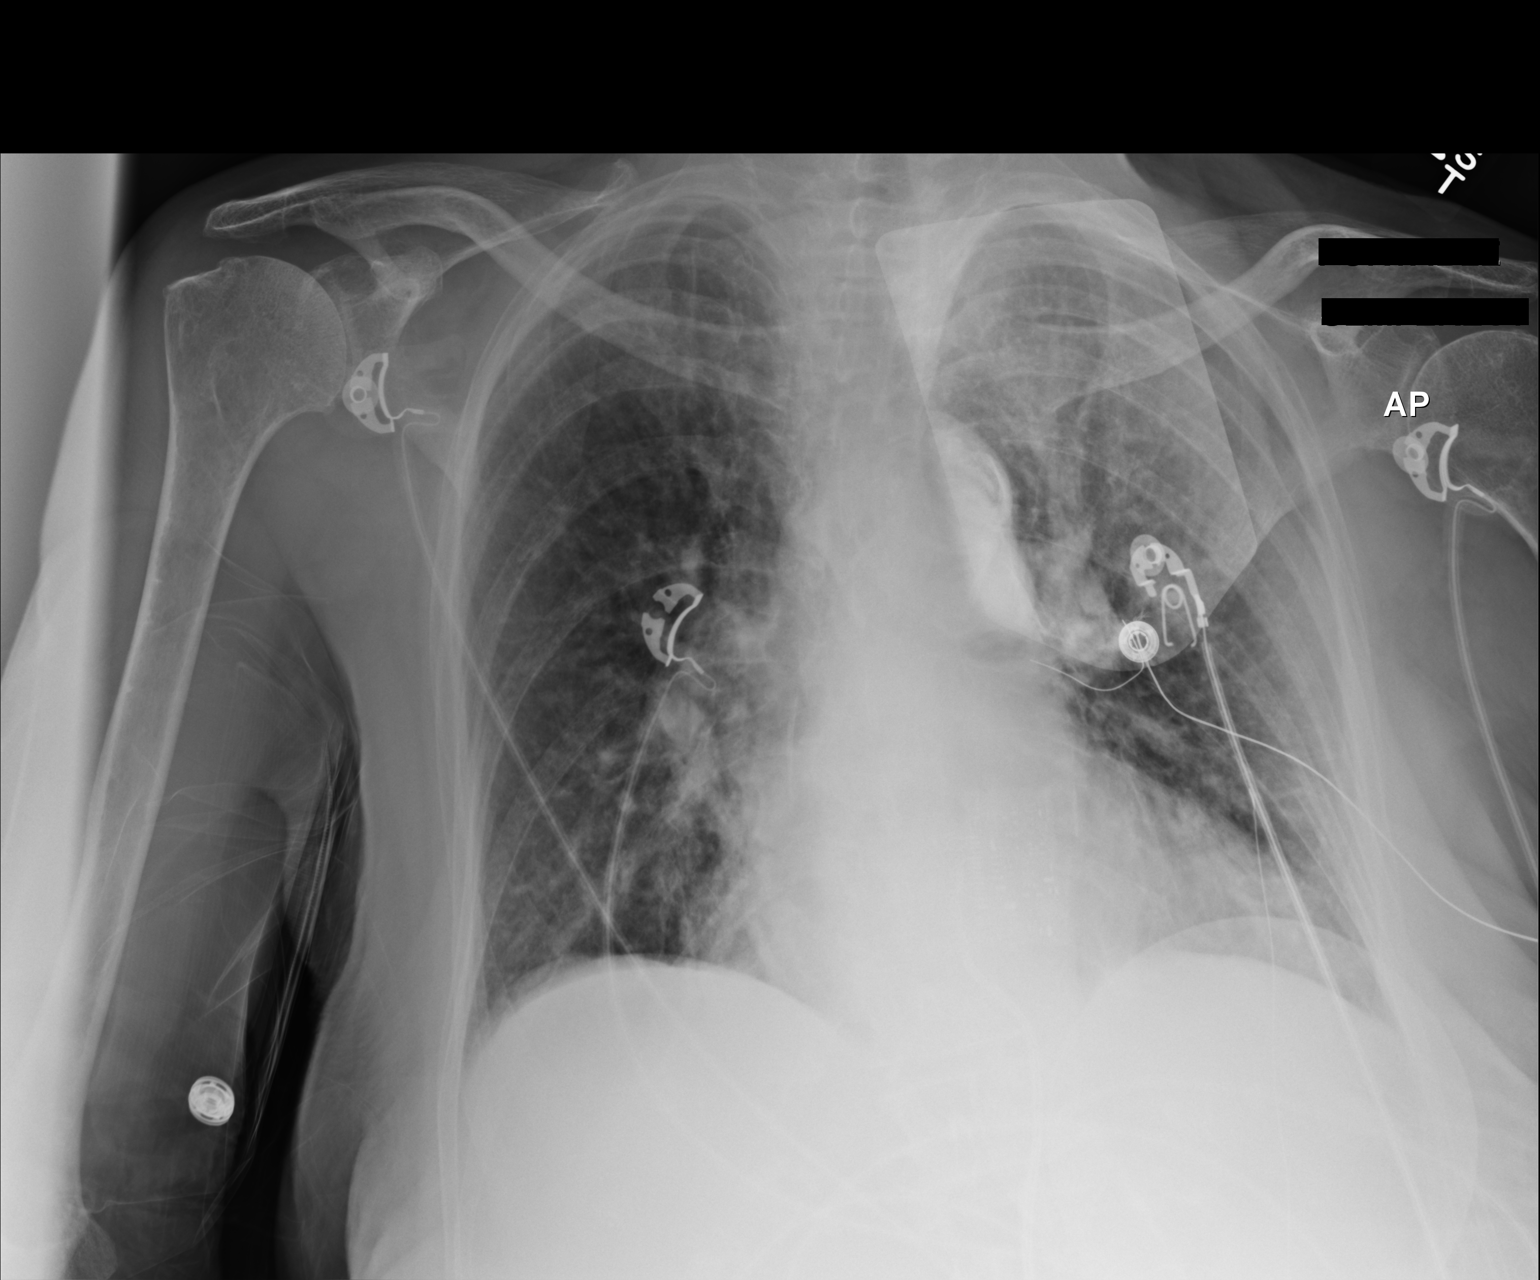

[1 of 1 positions shown; findings below may reference images not displayed]

FINDINGS: Mild diffuse interstitial prominence and nodularity may represent a
degree of edema. Left lung base linear and streaky densities may be
related to crowding of the vasculature or atelectasis/ scarring.
Infiltrate is less likely. There is no focal consolidation, pleural
effusion, or pneumothorax. Stable cardiac silhouette.
Atherosclerotic calcification of the aorta. No acute osseous
pathology.
IMPRESSION: Chronic changes with possible mild interstitial edema. Left lung
base atelectatic changes. Infiltrate is less likely.
